# Patient Record
Sex: Male | Born: 1945 | Race: White | Hispanic: No | Marital: Married | Smoking: Never smoker
Health system: Southern US, Community
[De-identification: ages and names within clinical notes are randomized; demographics above are authoritative.]

## PROBLEM LIST (undated history)

## (undated) DIAGNOSIS — I10 Essential (primary) hypertension: Secondary | ICD-10-CM

## (undated) DIAGNOSIS — D759 Disease of blood and blood-forming organs, unspecified: Secondary | ICD-10-CM

## (undated) DIAGNOSIS — L709 Acne, unspecified: Secondary | ICD-10-CM

## (undated) DIAGNOSIS — L309 Dermatitis, unspecified: Secondary | ICD-10-CM

## (undated) DIAGNOSIS — G4730 Sleep apnea, unspecified: Secondary | ICD-10-CM

## (undated) DIAGNOSIS — M1990 Unspecified osteoarthritis, unspecified site: Secondary | ICD-10-CM

## (undated) DIAGNOSIS — Z8669 Personal history of other diseases of the nervous system and sense organs: Secondary | ICD-10-CM

## (undated) HISTORY — DX: Dermatitis, unspecified: L30.9

## (undated) HISTORY — DX: Acne, unspecified: L70.9

## (undated) HISTORY — DX: Essential (primary) hypertension: I10

## (undated) HISTORY — PX: BACK SURGERY: SHX140

## (undated) HISTORY — DX: Personal history of other diseases of the nervous system and sense organs: Z86.69

## (undated) HISTORY — PX: CARPAL TUNNEL RELEASE: SHX101

## (undated) HISTORY — PX: TONSILLECTOMY: SUR1361

## (undated) HISTORY — PX: ROTATOR CUFF REPAIR: SHX139

## (undated) HISTORY — PX: CATARACT EXTRACTION: SUR2

## (undated) HISTORY — PX: PERCUTANEOUS LIVER BIOPSY: SUR136

---

## 1991-10-28 DIAGNOSIS — D759 Disease of blood and blood-forming organs, unspecified: Secondary | ICD-10-CM

## 1991-10-28 HISTORY — DX: Disease of blood and blood-forming organs, unspecified: D75.9

## 1998-02-21 ENCOUNTER — Encounter (HOSPITAL_COMMUNITY): Admission: RE | Admit: 1998-02-21 | Discharge: 1998-05-22 | Payer: Self-pay | Admitting: Internal Medicine

## 1998-06-13 ENCOUNTER — Encounter (HOSPITAL_COMMUNITY): Admission: RE | Admit: 1998-06-13 | Discharge: 1998-09-11 | Payer: Self-pay | Admitting: Internal Medicine

## 1998-10-03 ENCOUNTER — Encounter (HOSPITAL_COMMUNITY): Admission: RE | Admit: 1998-10-03 | Discharge: 1999-01-01 | Payer: Self-pay | Admitting: Internal Medicine

## 1999-01-31 ENCOUNTER — Encounter (HOSPITAL_COMMUNITY): Admission: RE | Admit: 1999-01-31 | Discharge: 1999-05-01 | Payer: Self-pay | Admitting: Internal Medicine

## 1999-06-03 ENCOUNTER — Encounter (HOSPITAL_COMMUNITY): Admission: RE | Admit: 1999-06-03 | Discharge: 1999-09-01 | Payer: Self-pay | Admitting: Internal Medicine

## 1999-10-03 ENCOUNTER — Encounter (HOSPITAL_COMMUNITY): Admission: RE | Admit: 1999-10-03 | Discharge: 2000-01-01 | Payer: Self-pay | Admitting: Internal Medicine

## 1999-10-29 ENCOUNTER — Ambulatory Visit: Admission: RE | Admit: 1999-10-29 | Discharge: 1999-10-29 | Payer: Self-pay | Admitting: Family Medicine

## 2000-01-27 ENCOUNTER — Encounter (HOSPITAL_COMMUNITY): Admission: RE | Admit: 2000-01-27 | Discharge: 2000-04-26 | Payer: Self-pay | Admitting: Internal Medicine

## 2000-03-05 ENCOUNTER — Encounter: Payer: Self-pay | Admitting: Family Medicine

## 2000-03-05 ENCOUNTER — Ambulatory Visit (HOSPITAL_COMMUNITY): Admission: RE | Admit: 2000-03-05 | Discharge: 2000-03-05 | Payer: Self-pay | Admitting: Family Medicine

## 2000-06-01 ENCOUNTER — Encounter (HOSPITAL_COMMUNITY): Admission: RE | Admit: 2000-06-01 | Discharge: 2000-08-30 | Payer: Self-pay | Admitting: Family Medicine

## 2000-09-28 ENCOUNTER — Encounter (HOSPITAL_COMMUNITY): Admission: RE | Admit: 2000-09-28 | Discharge: 2000-12-27 | Payer: Self-pay | Admitting: Family Medicine

## 2001-04-01 ENCOUNTER — Encounter (HOSPITAL_COMMUNITY): Admission: RE | Admit: 2001-04-01 | Discharge: 2001-06-30 | Payer: Self-pay | Admitting: Family Medicine

## 2001-07-29 ENCOUNTER — Encounter (HOSPITAL_COMMUNITY): Admission: RE | Admit: 2001-07-29 | Discharge: 2001-10-27 | Payer: Self-pay | Admitting: Family Medicine

## 2001-11-29 ENCOUNTER — Encounter (HOSPITAL_COMMUNITY): Admission: RE | Admit: 2001-11-29 | Discharge: 2002-02-27 | Payer: Self-pay | Admitting: Family Medicine

## 2002-03-29 ENCOUNTER — Encounter (HOSPITAL_COMMUNITY): Admission: RE | Admit: 2002-03-29 | Discharge: 2002-06-27 | Payer: Self-pay | Admitting: Family Medicine

## 2002-08-01 ENCOUNTER — Encounter (HOSPITAL_COMMUNITY): Admission: RE | Admit: 2002-08-01 | Discharge: 2002-10-30 | Payer: Self-pay | Admitting: Family Medicine

## 2002-11-28 ENCOUNTER — Encounter (HOSPITAL_COMMUNITY): Admission: RE | Admit: 2002-11-28 | Discharge: 2003-02-26 | Payer: Self-pay | Admitting: Family Medicine

## 2003-01-24 ENCOUNTER — Ambulatory Visit (HOSPITAL_COMMUNITY): Admission: RE | Admit: 2003-01-24 | Discharge: 2003-01-24 | Payer: Self-pay | Admitting: Gastroenterology

## 2003-04-06 ENCOUNTER — Encounter (HOSPITAL_COMMUNITY): Admission: RE | Admit: 2003-04-06 | Discharge: 2003-07-05 | Payer: Self-pay | Admitting: Family Medicine

## 2003-06-13 ENCOUNTER — Encounter: Payer: Self-pay | Admitting: Family Medicine

## 2003-06-13 ENCOUNTER — Ambulatory Visit (HOSPITAL_COMMUNITY): Admission: RE | Admit: 2003-06-13 | Discharge: 2003-06-13 | Payer: Self-pay | Admitting: Family Medicine

## 2003-09-11 ENCOUNTER — Inpatient Hospital Stay (HOSPITAL_COMMUNITY): Admission: EM | Admit: 2003-09-11 | Discharge: 2003-09-13 | Payer: Self-pay | Admitting: Emergency Medicine

## 2004-01-11 ENCOUNTER — Encounter (HOSPITAL_COMMUNITY): Admission: RE | Admit: 2004-01-11 | Discharge: 2004-04-10 | Payer: Self-pay | Admitting: Family Medicine

## 2004-01-31 ENCOUNTER — Ambulatory Visit (HOSPITAL_COMMUNITY): Admission: RE | Admit: 2004-01-31 | Discharge: 2004-02-01 | Payer: Self-pay | Admitting: Orthopaedic Surgery

## 2004-04-19 ENCOUNTER — Encounter (HOSPITAL_COMMUNITY): Admission: RE | Admit: 2004-04-19 | Discharge: 2004-07-18 | Payer: Self-pay | Admitting: Family Medicine

## 2004-04-26 ENCOUNTER — Encounter: Admission: RE | Admit: 2004-04-26 | Discharge: 2004-04-26 | Payer: Self-pay | Admitting: Orthopaedic Surgery

## 2004-08-12 ENCOUNTER — Encounter (HOSPITAL_COMMUNITY): Admission: RE | Admit: 2004-08-12 | Discharge: 2004-11-10 | Payer: Self-pay | Admitting: Family Medicine

## 2012-05-31 ENCOUNTER — Encounter: Payer: Self-pay | Admitting: Internal Medicine

## 2012-06-01 ENCOUNTER — Encounter: Payer: Self-pay | Admitting: Internal Medicine

## 2012-06-01 ENCOUNTER — Ambulatory Visit (INDEPENDENT_AMBULATORY_CARE_PROVIDER_SITE_OTHER): Payer: Medicare Other | Admitting: Internal Medicine

## 2012-06-01 VITALS — BP 118/72 | HR 106 | Ht 68.0 in | Wt 227.0 lb

## 2012-06-01 DIAGNOSIS — G4733 Obstructive sleep apnea (adult) (pediatric): Secondary | ICD-10-CM

## 2012-06-01 NOTE — Patient Instructions (Addendum)
Order- Melvin Hamilton sleep study   Dx OSA  If the study confirms a diagnosis of sleep apnea, we will be able to work through a DME company to set up CPAP.

## 2012-06-01 NOTE — Progress Notes (Signed)
06/01/12- 66yoM referred by Dr Glean Salen for complaint of snoring with remote history of sleep apnea. He had a sleep study in the 1990s but never got CPAP. Evaluated by Dr. Macie Burows for occasional nasal stuffiness. He denies daytime sleepiness. ENT surgery limited to tonsils. 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 Never smoked. Family history negative for sleep apnea but positive for myocardial infarction. Bedtime between 10 and 12 PM estimating sleep latency 45-60 minutes, waking 2-3 times during the night before up at 8 AM.  Prior to Admission medications   Medication Sig Start Date End Date Taking? Authorizing Provider  doxycycline (VIBRAMYCIN) 100 MG capsule Take 100 mg by mouth daily.    Historical Provider, MD  losartan (COZAAR) 100 MG tablet Take 100 mg by mouth daily.    Historical Provider, MD  naproxen (NAPROSYN) 500 MG tablet Take 500 mg by mouth 2 (two) times daily with a meal.    Historical Provider, MD  phentermine 37.5 MG capsule Take 37.5 mg by mouth daily.    Historical Provider, MD   Past Medical History  Diagnosis Date  . Hypertension   . H/O sleep apnea   . Dermatitis   . Acne    Past Surgical History  Procedure Date  . Back surgery   . Rotator cuff repair     x 2  . Cataract extraction   . Tonsillectomy   . Carpal tunnel release     right   Family History  Problem Relation Age of Onset  . Heart attack Father   . Heart attack Brother   . Prostate cancer Brother     cancer spreading   History   Social History  . Marital Status: Married    Spouse Name: N/A    Number of Children: 2  . Years of Education: N/A   Occupational History  . retired    Social History Main Topics  . Smoking status: Never Smoker   . Smokeless tobacco: Former Neurosurgeon  . Alcohol Use: Yes     2-3 times weekly liqour and beer  . Drug Use: No  . Sexually Active: Not on file   Other Topics Concern  . Not on file     Social History Narrative  . No narrative on file   ROS-see HPI Constitutional:   No-   weight loss, night sweats, fevers, chills, fatigue, lassitude. HEENT:   No-  headaches, difficulty swallowing, tooth/dental problems, sore throat,       No-  sneezing, itching, ear ache, +nasal congestion, post nasal drip,  CV:  No-   chest pain, orthopnea, PND, swelling in lower extremities, anasarca,  dizziness, palpitations Resp: No-   shortness of breath with exertion or at rest.              No-   productive cough,  No non-productive cough,  No- coughing up of blood.              No-   change in color of mucus.  No- wheezing.   Skin: No-   rash or lesions. GI:  No-   heartburn, indigestion, abdominal pain, nausea, vomiting, diarrhea,                 change in bowel habits, loss of appetite GU: No-   dysuria, change in color of urine, no urgency or frequency.  No- flank pain. MS:  No-   joint pain or  swelling.  No- decreased range of motion.  No- back pain. Neuro-     nothing unusual Psych:  No- change in mood or affect. No depression or anxiety.  No memory loss.  OBJ- Physical Exam General- Alert, Oriented, Affect-appropriate, Distress- none acute, stocky body build Skin- rash-none, lesions- none, excoriation- none Lymphadenopathy- none Head- atraumatic            Eyes- Gross vision intact, PERRLA, conjunctivae and secretions clear            Ears- Hearing, canals-normal            Nose- Clear, no-Septal dev, mucus, polyps, erosion, perforation             Throat- Mallampati III , mucosa clear , drainage- none, tonsils- atrophic, own teeth Neck- flexible , trachea midline, no stridor , thyroid nl, carotid no bruit Chest - symmetrical excursion , unlabored           Heart/CV- RRR , no murmur , no gallop  , no rub, nl s1 s2                           - JVD- none , edema- none, stasis changes- none, varices- none           Lung- clear to P&A, wheeze- none, cough- none , dullness-none, rub-  none           Chest wall-  Abd- tender-no, distended-no, bowel sounds-present, HSM- no Br/ Gen/ Rectal- Not done, not indicated Extrem- cyanosis- none, clubbing, none, atrophy- none, strength- nl Neuro- grossly intact to observation

## 2012-06-03 ENCOUNTER — Ambulatory Visit (INDEPENDENT_AMBULATORY_CARE_PROVIDER_SITE_OTHER): Payer: Medicare Other | Admitting: Internal Medicine

## 2012-06-03 DIAGNOSIS — G4733 Obstructive sleep apnea (adult) (pediatric): Secondary | ICD-10-CM

## 2012-06-06 ENCOUNTER — Encounter: Payer: Self-pay | Admitting: Internal Medicine

## 2012-06-06 DIAGNOSIS — G4733 Obstructive sleep apnea (adult) (pediatric): Secondary | ICD-10-CM | POA: Insufficient documentation

## 2012-06-06 NOTE — Assessment & Plan Note (Signed)
History the sleep apnea was documented in the 1990s but he did not followup for treatment. He has gained some weight since then. Plan-sleep study-unattended if appropriate. We discussed the physiology of sleep apnea, medical concerns, driving responsibility and sleep hygiene.

## 2012-06-07 ENCOUNTER — Institutional Professional Consult (permissible substitution): Payer: Self-pay | Admitting: Pulmonary Disease

## 2012-06-07 ENCOUNTER — Other Ambulatory Visit: Payer: Self-pay | Admitting: Internal Medicine

## 2012-06-07 DIAGNOSIS — G4733 Obstructive sleep apnea (adult) (pediatric): Secondary | ICD-10-CM

## 2012-06-08 ENCOUNTER — Telehealth: Payer: Self-pay | Admitting: Internal Medicine

## 2012-06-08 NOTE — Telephone Encounter (Signed)
Results showed patient does have OSA and needs CPAP-order has been faxed by [facility] to [facility] for patient to be set up for CPAP. I had to leave message for patient to call back and speak with me or triage regarding this.

## 2012-06-08 NOTE — Telephone Encounter (Signed)
Spoke with patient and advised that Jasmine December at [employer] was trying to obtain prior authorization on cpap device. Explained to patient that this process normally takes 48-72 hours to obtain prior authorization, and appointment to scheduled for cpap set up. Pt stated that [employer] had called and left a message for him, but he was not available when she called. Advised patient to contact Jasmine December at [employer] [phone]) to check on status of authorization and cpap set up appointment. Rhonda J Cobb

## 2012-06-08 NOTE — Telephone Encounter (Signed)
Spoke with patient-aware that results from Home Sleep Study shows he would benefit from CPAP due to Dx of OSA. PT aware order sent to [facility] to set up CPAP-pt states he has not heard from the company and was upset that he had to call for results. I expressed my apologies as I was not aware that patient did not know the results and stated I would express this concern to [facility]. [employer] stated they did not have order-spoke with Bjorn Loser as Almyra Free gone for the day-she will contact them to find out what happened. Will forward to [place] to document.    Pt's number: [phone]

## 2012-06-08 NOTE — Telephone Encounter (Signed)
Called, spoke with pt.  States he had a home sleep study done on Aug 6 but hasn't heard anything about the results.  Results printed and placed on CDY's desk -- pls advise.  Thank you.

## 2012-06-08 NOTE — Telephone Encounter (Signed)
Called and spoke with Jasmine December at [employer]. She stated that she received the referral and they are in the process of obtaining pre approval with pt's secondary [employer]. Pt's insurance requires that this is completed before cpap is set up. Asked Jasmine December to contact pt today to explain this to him. I will follow up later this afternoon with a phone call to the patient. Rhonda J Cobb

## 2012-07-13 ENCOUNTER — Ambulatory Visit: Payer: Medicare Other | Admitting: Internal Medicine

## 2012-08-05 ENCOUNTER — Ambulatory Visit (INDEPENDENT_AMBULATORY_CARE_PROVIDER_SITE_OTHER): Payer: Medicare Other | Admitting: Internal Medicine

## 2012-08-05 VITALS — BP 134/84 | HR 81 | Ht 68.0 in | Wt 229.4 lb

## 2012-08-05 DIAGNOSIS — G4733 Obstructive sleep apnea (adult) (pediatric): Secondary | ICD-10-CM

## 2012-08-10 ENCOUNTER — Encounter: Payer: Self-pay | Admitting: Internal Medicine

## 2012-08-11 ENCOUNTER — Encounter: Payer: Self-pay | Admitting: Internal Medicine

## 2012-08-11 NOTE — Progress Notes (Signed)
06/01/12- 66yoM referred by Dr Glean Salen for complaint of snoring with remote history of sleep apnea. He had a sleep study in the 1990s but never got CPAP. Evaluated by Dr. Macie Burows for occasional nasal stuffiness. He denies daytime sleepiness. ENT surgery limited to tonsils. 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 Never smoked. Family history negative for sleep apnea but positive for myocardial infarction. Bedtime between 10 and 12 PM estimating sleep latency 45-60 minutes, waking 2-3 times during the night before up at 8 AM.  08/05/12-  66yoM referred by Dr Glean Salen for complaint of snoring with remote history of sleep apnea. He had a sleep study in the 1990s but never got CPAP. Unattended [place] [facility]) home sleep study on 06/01/2012-AHI 16 per hour , moderately severe obstructive sleep apnea. We got him started with CPAP/ [facility] and are pending download report. He has been using all night every night and doing well. He is satisfied except for some difficulty initiating sleep. We discussed sleep hygiene and management of an insomnia component  ROS-see HPI Constitutional:   No-   weight loss, night sweats, fevers, chills, fatigue, lassitude. HEENT:   No-  headaches, difficulty swallowing, tooth/dental problems, sore throat,       No-  sneezing, itching, ear ache, +nasal congestion, post nasal drip,  CV:  No-   chest pain, orthopnea, PND, swelling in lower extremities, anasarca,  dizziness, palpitations Resp: No-   shortness of breath with exertion or at rest.              No-   productive cough,  No non-productive cough,  No- coughing up of blood.              No-   change in color of mucus.  No- wheezing.   Skin: No-   rash or lesions. GI:  No-   heartburn, indigestion, abdominal pain, nausea, vomiting,  GU:  MS:  No-   joint pain or swelling.   Neuro-     nothing unusual Psych:  No- change in mood or affect. No  depression or anxiety.  No memory loss.  OBJ- Physical Exam General- Alert, Oriented, Affect-guarded/ surly?, Distress- none acute, stocky body build Skin- rash-none, lesions- none, excoriation- none Lymphadenopathy- none Head- atraumatic            Eyes- Gross vision intact, PERRLA, conjunctivae and secretions clear            Ears- Hearing, canals-normal            Nose- Clear, no-Septal dev, mucus, polyps, erosion, perforation             Throat- Mallampati III , mucosa clear , drainage- none, tonsils- atrophic, own teeth Neck- flexible , trachea midline, no stridor , thyroid nl, carotid no bruit Chest - symmetrical excursion , unlabored           Heart/CV- RRR , no murmur , no gallop  , no rub, nl s1 s2                           - JVD- none , edema- none, stasis changes- none, varices- none           Lung- clear to P&A, wheeze- none, cough- none , dullness-none, rub- none           Chest wall-  Abd-  Br/ Gen/ Rectal- Not done, not  indicated Extrem- cyanosis- none, clubbing, none, atrophy- none, strength- nl Neuro- grossly intact to observation

## 2012-08-11 NOTE — Patient Instructions (Addendum)
We will contact your DME company for download report and use that to set a fixed CPAP pressure.  Prescription for Ambien 10 mg  Please call as needed

## 2012-08-11 NOTE — Assessment & Plan Note (Signed)
We are awaiting the download from his home care company. I anticipate changing to a fixed pressure as discussed with him. We will help his difficulty initiating sleep by trying Ambien. We discussed tolerance, dependence, sleepwalking/parasomnias. He is encouraged to call as needed.

## 2012-08-28 ENCOUNTER — Observation Stay (HOSPITAL_COMMUNITY)
Admission: EM | Admit: 2012-08-28 | Discharge: 2012-08-29 | Disposition: A | Discharge status: Home / Self Care | Payer: Medicare Other | Attending: Internal Medicine | Admitting: Internal Medicine

## 2012-08-28 ENCOUNTER — Emergency Department (HOSPITAL_COMMUNITY): Payer: Medicare Other

## 2012-08-28 ENCOUNTER — Encounter (HOSPITAL_COMMUNITY): Payer: Self-pay | Admitting: Internal Medicine

## 2012-08-28 ENCOUNTER — Other Ambulatory Visit: Payer: Self-pay

## 2012-08-28 DIAGNOSIS — Y9289 Other specified places as the place of occurrence of the external cause: Secondary | ICD-10-CM | POA: Insufficient documentation

## 2012-08-28 DIAGNOSIS — S2239XA Fracture of one rib, unspecified side, initial encounter for closed fracture: Secondary | ICD-10-CM

## 2012-08-28 DIAGNOSIS — G4733 Obstructive sleep apnea (adult) (pediatric): Secondary | ICD-10-CM

## 2012-08-28 DIAGNOSIS — R55 Syncope and collapse: Secondary | ICD-10-CM

## 2012-08-28 DIAGNOSIS — W14XXXA Fall from tree, initial encounter: Secondary | ICD-10-CM | POA: Diagnosis present

## 2012-08-28 DIAGNOSIS — W1789XA Other fall from one level to another, initial encounter: Secondary | ICD-10-CM | POA: Insufficient documentation

## 2012-08-28 DIAGNOSIS — I951 Orthostatic hypotension: Principal | ICD-10-CM | POA: Insufficient documentation

## 2012-08-28 DIAGNOSIS — Z79899 Other long term (current) drug therapy: Secondary | ICD-10-CM | POA: Insufficient documentation

## 2012-08-28 DIAGNOSIS — S2249XA Multiple fractures of ribs, unspecified side, initial encounter for closed fracture: Secondary | ICD-10-CM

## 2012-08-28 DIAGNOSIS — Y9389 Activity, other specified: Secondary | ICD-10-CM | POA: Insufficient documentation

## 2012-08-28 DIAGNOSIS — I10 Essential (primary) hypertension: Secondary | ICD-10-CM

## 2012-08-28 LAB — CBC WITH DIFFERENTIAL/PLATELET
Basophils Relative: 0 % (ref 0–1)
Eosinophils Relative: 0 % (ref 0–5)
Hemoglobin: 11.8 g/dL — ABNORMAL LOW (ref 13.0–17.0)
Lymphocytes Relative: 4 % — ABNORMAL LOW (ref 12–46)
MCH: 27.3 pg (ref 26.0–34.0)
Monocytes Relative: 4 % (ref 3–12)
Neutrophils Relative %: 92 % — ABNORMAL HIGH (ref 43–77)
Platelets: 243 10*3/uL (ref 150–400)
RBC: 4.33 MIL/uL (ref 4.22–5.81)
WBC: 16.8 10*3/uL — ABNORMAL HIGH (ref 4.0–10.5)

## 2012-08-28 LAB — COMPREHENSIVE METABOLIC PANEL
ALT: 22 U/L (ref 0–53)
AST: 28 U/L (ref 0–37)
Alkaline Phosphatase: 72 U/L (ref 39–117)
CO2: 24 mEq/L (ref 19–32)
Calcium: 8.8 mg/dL (ref 8.4–10.5)
Chloride: 99 mEq/L (ref 96–112)
GFR calc Af Amer: >90 mL/min (ref >90)
GFR calc non Af Amer: >90 mL/min (ref >90)
Glucose, Bld: 131 mg/dL — ABNORMAL HIGH (ref 70–99)
Potassium: 3.7 mEq/L (ref 3.5–5.1)
Sodium: 134 mEq/L — ABNORMAL LOW (ref 135–145)
Total Bilirubin: 0.4 mg/dL (ref 0.3–1.2)

## 2012-08-28 MED ORDER — FENTANYL CITRATE 0.05 MG/ML IJ SOLN
100.0000 ug | Freq: Once | INTRAMUSCULAR | Status: AC
  Administered 2012-08-28: 100 ug via INTRAVENOUS
  Filled 2012-08-28: qty 2

## 2012-08-28 MED ORDER — SODIUM CHLORIDE 0.9 % IV BOLUS (SEPSIS): 500.0000 mL | Freq: Once | INTRAVENOUS | Status: DC

## 2012-08-28 MED ORDER — SODIUM CHLORIDE 0.9 % IV SOLN
INTRAVENOUS | Status: DC
  Administered 2012-08-28: 125 mL/h via INTRAVENOUS
  Administered 2012-08-28: 21:00:00 via INTRAVENOUS

## 2012-08-28 MED ORDER — MORPHINE SULFATE 2 MG/ML IJ SOLN
2.0000 mg | INTRAMUSCULAR | Status: DC | PRN
  Administered 2012-08-28 – 2012-08-29 (×4): 2 mg via INTRAVENOUS
  Filled 2012-08-28 (×3): qty 1

## 2012-08-28 NOTE — ED Notes (Signed)
Pt presents to the ED after falling from a deer stand which was about 10 feet in the air.  Pt is complaining of rib pain.  Pt states he lost consciousness for an undetermined amount of time.

## 2012-08-28 NOTE — H&P (Signed)
PCP:    Quitman Livings, MD [facility]   Chief Complaint:   Fall after syncope  HPI: Melvin Hamilton is a 66 y.o. male   has a past medical history of Hypertension; H/O sleep apnea; Dermatitis; and Acne.   Presented with  Here after a fall out of tree. 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 This event took place at 5 pm and was un vitnesed.He was conscious rapidly after hitting the ground. No urinary or bowel incontinence. No tongue biting. Has no prior hx of syncope felt well prior to this. He states he had a shot of liquor about 12 pm.  He states he is not a heavy drinker. His brother died of MI at age of 22. NO chest pains or SOB with activity.  He uses CPAP for his sleep apnea.   Review of Systems:    Pertinent positives include: syncope  Constitutional:  No weight loss, night sweats, Fevers, chills, fatigue, weight loss  HEENT:  No headaches, Difficulty swallowing,Tooth/dental problems,Sore throat,  No sneezing, itching, ear ache, nasal congestion, post nasal drip,  Cardio-vascular:  No chest pain, Orthopnea, PND, anasarca, dizziness, palpitations.no Bilateral lower extremity swelling  GI:  No heartburn, indigestion, abdominal pain, nausea, vomiting, diarrhea, change in bowel habits, loss of appetite, melena, blood in stool, hematemesis Resp:  no shortness of breath at rest. No dyspnea on exertion, No excess mucus, no productive cough, No non-productive cough, No coughing up of blood.No change in color of mucus.No wheezing. Skin:  no rash or lesions. No jaundice GU:  no dysuria, change in color of urine, no urgency or frequency. No straining to urinate.  No flank pain.  Musculoskeletal:  No joint pain or no joint swelling. No decreased range of motion. No back pain.  Psych:  No change in mood or affect. No depression or anxiety. No memory loss.  Neuro: no  localizing neurological complaints, no tingling, no weakness, no double vision, no gait abnormality, no slurred speech, no confusion  Otherwise ROS are negative except for above, 10 systems were reviewed  Past Medical History: Past Medical History  Diagnosis Date  . Hypertension   . H/O sleep apnea   . Dermatitis   . Acne    Past Surgical History  Procedure Date  . Back surgery   . Rotator cuff repair     x 2  . Cataract extraction   . Tonsillectomy   . Carpal tunnel release     right     Medications: Prior to Admission medications   Medication Sig Start Date End Date Taking? Authorizing Provider  losartan (COZAAR) 100 MG tablet Take 100 mg by mouth daily.   Yes Historical Provider, MD    Allergies:  No Known Allergies  Social History:  Ambulatory   independently   Lives at  Home with wife   reports that he has never smoked. He has quit using smokeless tobacco. He reports that he drinks alcohol. He reports that he does not use illicit drugs.   Family History: family history includes Heart attack in his brother and father and Prostate cancer in his brother.    Physical Exam: Patient Vitals for the past 24 hrs:  BP Temp Temp src Pulse Resp SpO2  08/28/12 2056 118/86 mmHg 97.6 F (36.4 C) Oral 82  18  96 %  1. General:  in No Acute distress 2. Psychological: Alert and  Oriented 3. Head/ENT:     Dry Mucous Membranes                          Head bruise noted over left forehead and left side of the lip, neck supple                          Normal  Dentition 4. SKIN: normal  Skin turgor,  Skin clean Dry and intact no rash, some bruising noted 5. Heart: Regular rate and rhythm no Murmur, Rub or gallop 6. Lungs: Clear to auscultation bilaterally, no wheezes or crackles   7. Abdomen: Soft, non-tender, Non distended 8. Lower extremities: no clubbing, cyanosis, or edema 9. Neurologically Strength 5/5 in all 4 ext. CN 2-12 intact 10. MSK: Normal range of  motion  body mass index is unknown because there is no height or weight on file.   Labs on Admission:   [facility] 08/28/12 2108  NA 134*  K 3.7  CL 99  CO2 24  GLUCOSE 131*  BUN 16  CREATININE 0.79  CALCIUM 8.8  MG --  PHOS --    Basename 08/28/12 2108  AST 28  ALT 22  ALKPHOS 72  BILITOT 0.4  PROT 6.7  ALBUMIN 3.8   No results found for this basename: LIPASE:2,AMYLASE:2 in the last 72 hours  Basename 08/28/12 2108  WBC 16.8*  NEUTROABS 15.4*  HGB 11.8*  HCT 35.6*  MCV 82.2  PLT 243    Basename 08/28/12 2108  CKTOTAL --  CKMB --  CKMBINDEX --  TROPONINI <0.30   No results found for this basename: TSH,T4TOTAL,FREET3,T3FREE,THYROIDAB in the last 72 hours No results found for this basename: VITAMINB12:2,FOLATE:2,FERRITIN:2,TIBC:2,IRON:2,RETICCTPCT:2 in the last 72 hours No results found for this basename: HGBA1C    The CrCl is unknown because both a height and weight (above a minimum accepted value) are required for this calculation. ABG No results found for this basename: phart, pco2, po2, hco3, tco2, acidbasedef, o2sat     No results found for this basename: DDIMER     Other results:  I have pearsonaly reviewed this: ECG REPORT  Rate:79  Rhythm: NSR ST&T Change: T wave flattening in III and avF   Cultures: No results found for this basename: sdes, specrequest, cult, reptstatus       Radiological Exams on Admission: Ct Head Wo Contrast  08/28/2012  *RADIOLOGY REPORT*  Clinical Data:  syncope, fall.  CT HEAD WITHOUT CONTRAST CT CERVICAL SPINE WITHOUT CONTRAST  Technique:  Multidetector CT imaging of the head and cervical spine was performed following the standard protocol without intravenous contrast.  Multiplanar CT image reconstructions of the cervical spine were also generated.  Comparison:  None.  CT HEAD  Findings: No acute intracranial abnormality.  Specifically, no hemorrhage, hydrocephalus, mass lesion, acute infarction, or significant  intracranial injury.  No acute calvarial abnormality. Visualized paranasal sinuses and mastoids clear.  Orbital soft tissues unremarkable.  IMPRESSION: No acute intracranial abnormality.  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  Prevertebral soft tissues are normal. Alignment is normal.  No fracture.  No epidural or paraspinal hematoma.  IMPRESSION: Degenerative changes.  No acute findings.   Original Report Authenticated By: Charlett Nose, M.D.    Ct Cervical Spine Wo Contrast  08/28/2012  *RADIOLOGY REPORT*  Clinical Data:  syncope, fall.  CT HEAD WITHOUT CONTRAST CT CERVICAL SPINE WITHOUT CONTRAST  Technique:  Multidetector CT imaging of the head and cervical spine was performed following the standard protocol without intravenous contrast.  Multiplanar CT image reconstructions of the cervical spine were also generated.  Comparison:  None.  CT HEAD  Findings: No acute intracranial abnormality.  Specifically, no hemorrhage, hydrocephalus, mass lesion, acute infarction, or significant intracranial injury.  No acute calvarial abnormality. Visualized paranasal sinuses and mastoids clear.  Orbital soft tissues unremarkable.  IMPRESSION: No acute intracranial abnormality.  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  Prevertebral soft tissues are normal. Alignment is normal.  No fracture.  No epidural or paraspinal hematoma.  IMPRESSION: Degenerative changes.  No acute findings.   Original Report Authenticated By: Charlett Nose, M.D.    Dg Chest Port 1 View  08/28/2012  **ADDENDUM** CREATED: 08/28/2012 22:01:38  There are lateral rib fractures involving the left fourth, fifth and sixth ribs.  No pneumothorax.  **END ADDENDUM** SIGNED BY: Aubery Lapping. [place], M.D.   08/28/2012  *RADIOLOGY REPORT*  Clinical Data: Fall, chest and rib pain.  PORTABLE CHEST - 1 VIEW  Comparison: None.  Findings: Heart and  mediastinal contours are within normal limits. No focal opacities or effusions.  No acute bony abnormality.  IMPRESSION: No active cardiopulmonary disease.   Original Report Authenticated By: Charlett Nose, M.D.     Chart has been reviewed  Assessment/Plan  66 yo w hx of OSA and HTN with sudden syncope resulting in fall and rib fracture.  Present on Admission:  .Syncope and collapse - patient denies orthopnea, no chest pain, no prior similar events. Sudden nature of syncope makes cardiac event more likely. Will observe on telemetry obtain ECHO, carotid dopplers and cycle cardiac enzymes he would likely benefit from further cardiac evaluation. Recommend cardiac follow up or inpatient consult.  .Fall from tree - due to syncope .Obstructive sleep apnea - will write for CPAP .Hypertension - continue cozaar with holding parameters  .Rib fracture - pain control   Prophylaxis: Lovenox, Protonix  CODE STATUS: FULL CODE  Other plan as per orders.  I have spent a total of 55 min on this admission  Melvin Hamilton 08/28/2012, 10:58 PM

## 2012-08-28 NOTE — ED Provider Notes (Signed)
History     CSN: [id number]  Arrival date & time 08/28/12  2045   First MD Initiated Contact with Patient 08/28/12 2057      Chief Complaint  Patient presents with  . Fall    (Consider location/radiation/quality/duration/timing/severity/associated sxs/prior treatment) HPI This 66 year old male was a deer stand about 10 feet off the ground when he was watching 3 deer and he suddenly felt warm and flushed for a second and the next thing he knew he woke up on the ground having fallen off the deer stand. He has no idea how long he was unconscious for. He did not have any palpitations chest pain or shortness breath before he fell. He complains of severe left-sided chest pain only. He denies any headache or neck pain or back pain. He denies pain or injury to his extremities. He was able to walk over 100 yards to his vehicle to get help. He denies abdominal pain or vomiting. He has no focal weakness or numbness. He has severe left-sided chest pain only without other concerns. He does have some superficial abrasions on his head but states his tetanus is up-to-date. There is no active bleeding. He is no change in speech vision swallowing or understanding. He is no vertigo. He does not have a history of syncope.  No treatment PTA except one Vicodin no relief. Past Medical History  Diagnosis Date  . Hypertension   . H/O sleep apnea   . Dermatitis   . Acne     Past Surgical History  Procedure Date  . Back surgery   . Rotator cuff repair     x 2  . Cataract extraction   . Tonsillectomy   . Carpal tunnel release     right    Family History  Problem Relation Age of Onset  . Heart attack Father   . Heart attack Brother   . Prostate cancer Brother     cancer spreading    History  Substance Use Topics  . Smoking status: Never Smoker   . Smokeless tobacco: Former Neurosurgeon  . Alcohol Use: Yes     Comment: 2-3 times weekly liqour and beer      Review of Systems 10 Systems reviewed and  are negative for acute change except as noted in the HPI. Allergies  Review of patient's allergies indicates no known allergies.  Home Medications   Current Outpatient Rx  Name  Route  Sig  Dispense  Refill  . LOSARTAN POTASSIUM 100 MG PO TABS   Oral   Take 100 mg by mouth daily.         . OXYCODONE-ACETAMINOPHEN 7.5-325 MG PO TABS   Oral   Take 1 tablet by mouth every 4 (four) hours as needed for pain.   65 tablet   0     BP 123/71  Pulse 66  Temp 97.7 F (36.5 C) (Oral)  Resp 16  Ht 5\' 8"  (1.727 m)  Wt 223 lb 12.3 oz (101.5 kg)  BMI 34.02 kg/m2  SpO2 97%  Physical Exam  Nursing note and vitals reviewed. Constitutional:       Awake, alert, nontoxic appearance.  HENT:       Superficial scalp abrasions; dentition intact normal jaw occlusion and teeth intact no facial bony tenderness  Eyes: Right eye exhibits no discharge. Left eye exhibits no discharge.  Neck: Neck supple.  Cardiovascular: Normal rate and regular rhythm.   No murmur heard. Pulmonary/Chest: Effort normal and breath sounds normal. No  respiratory distress. He has no wheezes. He has no rales. He exhibits tenderness.       RS sat normal 98%; left chest wall diffusely tender with slight crepitus without subcut emphysema or apparent flail chest  Abdominal: Soft. There is no tenderness. There is no rebound.  Musculoskeletal: He exhibits no tenderness.       Baseline ROM, no obvious new focal weakness. CR<2secs all 4 ext and normal LT all 4 ext  Neurological: He is alert.       Mental status and motor strength appears baseline for patient and situation. GCS 15  pupils equal round reactive to light, extraocular movements intact, peripheral visual fields full to confrontation, no facial asymmetry, no pronator drift in his arms, normal light touch in all 4 extremities, normal bilateral finger to nose testing, the patient states he was walking normally prior to arrival.  Skin: No rash noted.  Psychiatric: He has a  normal mood and affect.    ED Course  Procedures (including critical care time) ECG: Sinus rhythm, ventricular rate 79, normal axis, left ventricular hypertrophy, no acute ischemic changes noted, inferior Q waves, no significant change noted status compared with November 2004  D/w [facility] for Obs due to syncope.Patient / Family / Caregiver informed of clinical course, understand medical decision-making process, and agree with plan.Pt stable in ED with no significant deterioration in condition.  Labs Reviewed  CBC WITH DIFFERENTIAL - Abnormal; Notable for the following:    WBC 16.8 (*)     Hemoglobin 11.8 (*)     HCT 35.6 (*)     Neutrophils Relative 92 (*)     Lymphocytes Relative 4 (*)     Neutro Abs 15.4 (*)     All other components within normal limits  COMPREHENSIVE METABOLIC PANEL - Abnormal; Notable for the following:    Sodium 134 (*)     Glucose, Bld 131 (*)     All other components within normal limits  COMPREHENSIVE METABOLIC PANEL - Abnormal; Notable for the following:    Calcium 8.2 (*)     Total Protein 5.7 (*)     Albumin 3.1 (*)     GFR calc non Af Amer 87 (*)     All other components within normal limits  CBC - Abnormal; Notable for the following:    RBC 3.81 (*)     Hemoglobin 10.2 (*)     HCT 31.8 (*)     All other components within normal limits  HEMOGLOBIN A1C - Abnormal; Notable for the following:    Hemoglobin A1C 5.8 (*)     Mean Plasma Glucose 120 (*)     All other components within normal limits  LIPID PANEL - Abnormal; Notable for the following:    HDL 33 (*)     All other components within normal limits  D-DIMER, QUANTITATIVE - Abnormal; Notable for the following:    D-Dimer, Quant 0.57 (*)     All other components within normal limits  URINALYSIS, ROUTINE W REFLEX MICROSCOPIC  TROPONIN I  MAGNESIUM  PHOSPHORUS  TSH  TROPONIN I   Ct Head Wo Contrast  08/28/2012  *RADIOLOGY REPORT*  Clinical Data:  syncope, fall.  CT HEAD WITHOUT CONTRAST CT  CERVICAL SPINE WITHOUT CONTRAST  Technique:  Multidetector CT imaging of the head and cervical spine was performed following the standard protocol without intravenous contrast.  Multiplanar CT image reconstructions of the cervical spine were also generated.  Comparison:  None.  CT HEAD  Findings: No acute intracranial abnormality.  Specifically, no hemorrhage, hydrocephalus, mass lesion, acute infarction, or significant intracranial injury.  No acute calvarial abnormality. Visualized paranasal sinuses and mastoids clear.  Orbital soft tissues unremarkable.  IMPRESSION: No acute intracranial abnormality.  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  Prevertebral soft tissues are normal. Alignment is normal.  No fracture.  No epidural or paraspinal hematoma.  IMPRESSION: Degenerative changes.  No acute findings.   Original Report Authenticated By: Charlett Nose, M.D.    Ct Cervical Spine Wo Contrast  08/28/2012  *RADIOLOGY REPORT*  Clinical Data:  syncope, fall.  CT HEAD WITHOUT CONTRAST CT CERVICAL SPINE WITHOUT CONTRAST  Technique:  Multidetector CT imaging of the head and cervical spine was performed following the standard protocol without intravenous contrast.  Multiplanar CT image reconstructions of the cervical spine were also generated.  Comparison:  None.  CT HEAD  Findings: No acute intracranial abnormality.  Specifically, no hemorrhage, hydrocephalus, mass lesion, acute infarction, or significant intracranial injury.  No acute calvarial abnormality. Visualized paranasal sinuses and mastoids clear.  Orbital soft tissues unremarkable.  IMPRESSION: No acute intracranial abnormality.  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  Prevertebral soft tissues are normal. Alignment is normal.  No fracture.  No epidural or paraspinal hematoma.  IMPRESSION: Degenerative changes.  No acute findings.    Original Report Authenticated By: Charlett Nose, M.D.    Dg Chest Port 1 View  08/28/2012  **ADDENDUM** CREATED: 08/28/2012 22:01:38  There are lateral rib fractures involving the left fourth, fifth and sixth ribs.  No pneumothorax.  **END ADDENDUM** SIGNED BY: Aubery Lapping. Dover, M.D.   08/28/2012  *RADIOLOGY REPORT*  Clinical Data: Fall, chest and rib pain.  PORTABLE CHEST - 1 VIEW  Comparison: None.  Findings: Heart and mediastinal contours are within normal limits. No focal opacities or effusions.  No acute bony abnormality.  IMPRESSION: No active cardiopulmonary disease.   Original Report Authenticated By: Charlett Nose, M.D.      1. Syncope   2. Multiple rib fractures   3. Hypertension   4. Obstructive sleep apnea   5. Syncope and collapse   6. Fall from tree   7. Rib fracture       MDM  The patient appears reasonably stabilized for admission considering the current resources, flow, and capabilities available in the ED at this time, and I doubt any other [facility] requiring further screening and/or treatment in the ED prior to admission.        Hurman Horn, MD 08/29/12 [phone]

## 2012-08-29 ENCOUNTER — Encounter (HOSPITAL_COMMUNITY): Payer: Self-pay | Admitting: *Deleted

## 2012-08-29 DIAGNOSIS — W1789XA Other fall from one level to another, initial encounter: Secondary | ICD-10-CM

## 2012-08-29 DIAGNOSIS — R55 Syncope and collapse: Secondary | ICD-10-CM

## 2012-08-29 DIAGNOSIS — I517 Cardiomegaly: Secondary | ICD-10-CM

## 2012-08-29 LAB — URINALYSIS, ROUTINE W REFLEX MICROSCOPIC
Bilirubin Urine: NEGATIVE
Hgb urine dipstick: NEGATIVE
Ketones, ur: NEGATIVE mg/dL
Specific Gravity, Urine: 1.017 (ref 1.005–1.030)
Urobilinogen, UA: 0.2 mg/dL (ref 0.0–1.0)

## 2012-08-29 LAB — TSH: TSH: 2.449 u[IU]/mL (ref 0.350–4.500)

## 2012-08-29 LAB — LIPID PANEL
Cholesterol: 136 mg/dL (ref 0–200)
HDL: 33 mg/dL — ABNORMAL LOW (ref >39)
Total CHOL/HDL Ratio: 4.1 RATIO
Triglycerides: 109 mg/dL (ref <150)
VLDL: 22 mg/dL (ref 0–40)

## 2012-08-29 LAB — COMPREHENSIVE METABOLIC PANEL
AST: 20 U/L (ref 0–37)
Albumin: 3.1 g/dL — ABNORMAL LOW (ref 3.5–5.2)
Chloride: 103 mEq/L (ref 96–112)
Creatinine, Ser: 0.86 mg/dL (ref 0.50–1.35)
Potassium: 3.8 mEq/L (ref 3.5–5.1)
Total Bilirubin: 0.6 mg/dL (ref 0.3–1.2)
Total Protein: 5.7 g/dL — ABNORMAL LOW (ref 6.0–8.3)

## 2012-08-29 LAB — HEMOGLOBIN A1C: Mean Plasma Glucose: 120 mg/dL — ABNORMAL HIGH (ref <117)

## 2012-08-29 LAB — CBC
HCT: 31.8 % — ABNORMAL LOW (ref 39.0–52.0)
MCH: 26.8 pg (ref 26.0–34.0)
MCV: 83.5 fL (ref 78.0–100.0)
Platelets: 226 10*3/uL (ref 150–400)
RBC: 3.81 MIL/uL — ABNORMAL LOW (ref 4.22–5.81)
WBC: 9.2 10*3/uL (ref 4.0–10.5)

## 2012-08-29 LAB — PHOSPHORUS: Phosphorus: 3.8 mg/dL (ref 2.3–4.6)

## 2012-08-29 LAB — MAGNESIUM: Magnesium: 1.8 mg/dL (ref 1.5–2.5)

## 2012-08-29 MED ORDER — ENOXAPARIN SODIUM 40 MG/0.4ML ~~LOC~~ SOLN
40.0000 mg | SUBCUTANEOUS | Status: DC
  Administered 2012-08-29: 40 mg via SUBCUTANEOUS
  Filled 2012-08-29 (×2): qty 0.4

## 2012-08-29 MED ORDER — HYDROCODONE-ACETAMINOPHEN 5-325 MG PO TABS
1.0000 | ORAL_TABLET | ORAL | Status: DC | PRN
  Administered 2012-08-29 (×2): 2 via ORAL
  Filled 2012-08-29: qty 2

## 2012-08-29 MED ORDER — ACETAMINOPHEN 650 MG RE SUPP: 650.0000 mg | Freq: Four times a day (QID) | RECTAL | Status: DC | PRN

## 2012-08-29 MED ORDER — ONDANSETRON HCL 4 MG PO TABS: 4.0000 mg | ORAL_TABLET | Freq: Four times a day (QID) | ORAL | Status: DC | PRN

## 2012-08-29 MED ORDER — ACETAMINOPHEN 325 MG PO TABS: 650.0000 mg | ORAL_TABLET | Freq: Four times a day (QID) | ORAL | Status: DC | PRN

## 2012-08-29 MED ORDER — SODIUM CHLORIDE 0.9 % IV SOLN
INTRAVENOUS | Status: DC
  Administered 2012-08-29: 04:00:00 via INTRAVENOUS

## 2012-08-29 MED ORDER — SODIUM CHLORIDE 0.9 % IJ SOLN: 3.0000 mL | Freq: Two times a day (BID) | INTRAMUSCULAR | Status: DC

## 2012-08-29 MED ORDER — DOCUSATE SODIUM 100 MG PO CAPS
100.0000 mg | ORAL_CAPSULE | Freq: Two times a day (BID) | ORAL | Status: DC
  Administered 2012-08-29: 100 mg via ORAL
  Filled 2012-08-29 (×2): qty 1

## 2012-08-29 MED ORDER — OXYCODONE-ACETAMINOPHEN 7.5-325 MG PO TABS: 1.0000 | ORAL_TABLET | ORAL | Status: DC | PRN

## 2012-08-29 MED ORDER — LOSARTAN POTASSIUM 50 MG PO TABS
100.0000 mg | ORAL_TABLET | Freq: Every day | ORAL | Status: DC
  Administered 2012-08-29: 100 mg via ORAL
  Filled 2012-08-29: qty 2

## 2012-08-29 MED ORDER — ONDANSETRON HCL 4 MG/2ML IJ SOLN: 4.0000 mg | Freq: Four times a day (QID) | INTRAMUSCULAR | Status: DC | PRN

## 2012-08-29 NOTE — Progress Notes (Signed)
Patient discharged home with wife. Discharge instructions given and explained to patient/wife and they verbalized understanding, patient denies any distress. Skin intact but bruises from fall at home. Patient accompanied home by wife.

## 2012-08-29 NOTE — Discharge Summary (Signed)
Physician Discharge Summary  Melvin Hamilton ZOX:[id number] DOB: [birth year] DOA: 08/28/2012  PCP: Quitman Livings, MD  Admit date: 08/28/2012 Discharge date: 08/29/2012  Recommendations for Outpatient Follow-up:  1. Pt will need to follow up with PCP in 2-3 weeks post discharge 2. Please obtain BMP to evaluate electrolytes and kidney function 3. Please also check CBC to evaluate Hg and Hct levels  Discharge Diagnoses: Syncopal episode secondary to orthostatic hypotension  Active Problems:  Obstructive sleep apnea  Syncope and collapse  Fall from tree  Hypertension  Rib fracture  Discharge Condition: Stable  Diet recommendation: Heart healthy diet discussed in details   History of present illness:  Pt is 66 yo male with hypertension and sleep apnea, who presented to [facility] ED after sustaining fall from a tree. 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 He has never had anything like that happen before. He denies any specific prodromal symptoms. He denies shortness of breath, chest pain, focal neurologic deficits.   Hospital Course:  Active Problems:  Syncope and collapse - likely secondary to orthostatic hypotension - pt provided fluids and has responded well - CE x 3 are within normal limits - no events on telemetry, normal 2 D ECHO but indicative of chronic diastolic CHF grade II   Fall from tree - analgesia as needed   Hypertension - now stable and at target    Rib fracture - analgesia as needed   Procedures/Studies 08/28/2012  IMPRESSION:  No acute intracranial abnormality.   CT CERVICAL SPINE   IMPRESSION:  Degenerative changes.  No acute findings.    Dg Chest Port 1 View There are lateral rib fractures involving the left fourth, fifth and sixth ribs.  No pneumothorax.    08/28/2012   IMPRESSION:  No active cardiopulmonary disease.     Consultations:  none  Antibiotics:  none  Discharge Exam: Filed Vitals:    08/29/12 0700  BP: 136/54  Pulse: 60  Temp: 98.4 F (36.9 C)  Resp: 18   Filed Vitals:   08/28/12 2056 08/29/12 0100 08/29/12 0700  BP: 118/86 124/60 136/54  Pulse: 82 68 60  Temp: 97.6 F (36.4 C) 98.2 F (36.8 C) 98.4 F (36.9 C)  TempSrc: Oral Oral Oral  Resp: 18 22 18   Height:  5\' 8"  (1.727 m)   Weight:  101.5 kg (223 lb 12.3 oz)   SpO2: 96% 99% 99%   General: Pt is alert, follows commands appropriately, not in acute distress Cardiovascular: Regular rate and rhythm, S1/S2 +, no murmurs, no rubs, no gallops Respiratory: Clear to auscultation bilaterally, no wheezing, no crackles, no rhonchi Abdominal: Soft, non tender, non distended, bowel sounds +, no guarding Extremities: no edema, no cyanosis, pulses palpable bilaterally DP and PT Neuro: Grossly nonfocal  Discharge Instructions  Discharge Orders    Future Appointments: Provider: Department: Dept Phone: Center:   06/09/2013 1:30 PM Waymon Budge, MD [facility] [phone] None     Future Orders Please Complete By Expires   Diet - low sodium heart healthy      Increase activity slowly          Medication List     As of 08/29/2012  9:42 AM    TAKE these medications         losartan 100 MG tablet   Commonly known as: COZAAR   Take 100 mg by mouth daily.  oxyCODONE-acetaminophen 7.5-325 MG per tablet   Commonly known as: PERCOCET   Take 1 tablet by mouth every 4 (four) hours as needed for pain.           Follow-up Information    Follow up with [facility], MD. In 2 weeks.   Contact information:   [address] [place] [address]           The results of significant diagnostics from this hospitalization (including imaging, microbiology, ancillary and laboratory) are listed below for reference.     Microbiology: No results found for this or any previous visit (from the past 240 hour(s)).   Labs: Basic Metabolic Panel:  Lab 08/29/12 6045 08/28/12 2108  NA 137 134*   K 3.8 3.7  CL 103 99  CO2 26 24  GLUCOSE 96 131*  BUN 15 16  CREATININE 0.86 0.79  CALCIUM 8.2* 8.8  MG 1.8 --  PHOS 3.8 --   Liver Function Tests:  Lab 08/29/12 0537 08/28/12 2108  AST 20 28  ALT 16 22  ALKPHOS 61 72  BILITOT 0.6 0.4  PROT 5.7* 6.7  ALBUMIN 3.1* 3.8   CBC:  Lab 08/29/12 0537 08/28/12 2108  WBC 9.2 16.8*  NEUTROABS -- 15.4*  HGB 10.2* 11.8*  HCT 31.8* 35.6*  MCV 83.5 82.2  PLT 226 243   Cardiac Enzymes:  Lab 08/29/12 0537 08/28/12 2108  CKTOTAL -- --  CKMB -- --  CKMBINDEX -- --  TROPONINI <0.30 <0.30    SIGNED: Time coordinating discharge: Over 30 minutes  Debbora Presto, MD  [facility] 08/29/2012, 9:42 AM Pager [phone]  If 7PM-7AM, please contact night-coverage [email or web address] Password [email or web address]

## 2012-08-29 NOTE — Progress Notes (Signed)
  Echocardiogram 2D Echocardiogram has been performed.  Melvin Hamilton 08/29/2012, 9:09 AM

## 2012-08-29 NOTE — Progress Notes (Signed)
Utilization review completed.  

## 2012-08-29 NOTE — Progress Notes (Signed)
VASCULAR LAB PRELIMINARY  PRELIMINARY  PRELIMINARY  PRELIMINARY  Carotid Dopplers completed.    Preliminary report:  There is no significant ICA stenosis.  Vertebral artery flow is antegrade.  Melvin Hamilton, 08/29/2012, 9:33 AM

## 2012-09-30 ENCOUNTER — Telehealth: Payer: Self-pay | Admitting: Internal Medicine

## 2012-09-30 ENCOUNTER — Other Ambulatory Visit: Payer: Self-pay | Admitting: Internal Medicine

## 2012-09-30 MED ORDER — ZOLPIDEM TARTRATE 10 MG PO TABS
10.0000 mg | ORAL_TABLET | Freq: Every evening | ORAL | Status: DC | PRN
Start: 2012-09-30 — End: 2012-12-14

## 2012-09-30 NOTE — Telephone Encounter (Signed)
Per CY-okay to refill Ambien 10 mg #30 take 1 po qhs prn sleep with 5 refills.

## 2012-09-30 NOTE — Telephone Encounter (Signed)
lmom for pt to call back  On meds requested.

## 2012-09-30 NOTE — Telephone Encounter (Signed)
rx for the Melvin Hamilton has been called to the pharmacy.  i called and lmom to make the pt aware.

## 2012-09-30 NOTE — Telephone Encounter (Signed)
Dup message °

## 2012-09-30 NOTE — Telephone Encounter (Signed)
Per last OV note on 08/05/12, AVS stated pt was to have Ambien 10 mg rx but pt never received and this medication is not on pt's current medicaion list. CY, pls advise if okay to call in for the pt and # of refills to give. Thanks.

## 2012-10-05 ENCOUNTER — Telehealth: Payer: Self-pay | Admitting: Internal Medicine

## 2012-10-05 NOTE — Telephone Encounter (Signed)
Called and spoke with pharmacist at [facility] She states that request was already faxed Please advise if this has been received triage, thanks

## 2012-10-06 NOTE — Telephone Encounter (Signed)
Called for PA at [phone]. Member #[id number].Ambien 10 mg APPROVED from 08/06/2012 through 10/06/2013. Pharmacy and patient notified.

## 2012-12-07 ENCOUNTER — Other Ambulatory Visit: Payer: Self-pay | Admitting: Orthopedic Surgery

## 2012-12-07 MED ORDER — DEXAMETHASONE SODIUM PHOSPHATE 10 MG/ML IJ SOLN: 10.0000 mg | Freq: Once | INTRAMUSCULAR | Status: DC

## 2012-12-07 NOTE — Progress Notes (Signed)
Preoperative surgical orders have been place into the Epic hospital system for Melvin Hamilton on 12/07/2012, 5:48 PM  by Patrica Duel for surgery on 12/22/2012.  Preop Knee Scope orders including IV Tylenol and IV Decadron as long as there are no contraindications to the above medications. Avel Peace, PA-C

## 2012-12-14 ENCOUNTER — Encounter (HOSPITAL_COMMUNITY): Payer: Self-pay | Admitting: Pharmacy Technician

## 2012-12-15 NOTE — Patient Instructions (Addendum)
20 EINER MEALS  12/15/2012   Your procedure is scheduled on:  12/22/12  [facility]  Report to [facility] at  1015     AM.  Call this number if you have problems the morning of surgery: [phone]       Remember:   Do not eat food  Or drink :After Midnight.TUESDAY NIGHT   Take these medicines the morning of surgery with A SIP OF WATER:  Norco if needed   .  Contacts, dentures or partial plates can not be worn to surgery  Leave suitcase in the car. After surgery it may be brought to your room.  For patients admitted to the hospital, checkout time is 11:00 AM day of  discharge.             SPECIAL INSTRUCTIONS- SEE  PREPARING FOR SURGERY INSTRUCTION SHEET-     DO NOT WEAR JEWELRY, LOTIONS, POWDERS, OR PERFUMES.  WOMEN-- DO NOT SHAVE LEGS OR UNDERARMS FOR 12 HOURS BEFORE SHOWERS. MEN MAY SHAVE FACE.  Patients discharged the day of surgery will not be allowed to drive home. IF going home the day of surgery, you must have a driver and someone to stay with you for the first 24 hours  Name and phone number of your driver:    Wife Daenease                                                                    Please read over the following fact sheets that you were given: MRSA Information, Incentive Spirometry Sheet, Blood Transfusion Sheet  Information                                                                                   Aston Lawhorn  PST 336  [id number]                 FAILURE TO FOLLOW THESE INSTRUCTIONS MAY RESULT IN  CANCELLATION   OF YOUR SURGERY                                                  Patient Signature _____________________________

## 2012-12-15 NOTE — Progress Notes (Signed)
1 view chest, EKG, eccho, OV Dr Maple Hudson 11/13 EPIC

## 2012-12-16 ENCOUNTER — Encounter (HOSPITAL_COMMUNITY)
Admission: RE | Admit: 2012-12-16 | Discharge: 2012-12-16 | Disposition: A | Discharge status: Home / Self Care | Payer: Medicare Other | Source: Ambulatory Visit | Attending: Orthopedic Surgery | Admitting: Orthopedic Surgery

## 2012-12-16 ENCOUNTER — Encounter (HOSPITAL_COMMUNITY): Payer: Self-pay

## 2012-12-16 HISTORY — DX: Disease of blood and blood-forming organs, unspecified: D75.9

## 2012-12-16 LAB — BASIC METABOLIC PANEL
BUN: 14 mg/dL (ref 6–23)
Calcium: 8.9 mg/dL (ref 8.4–10.5)
Chloride: 105 mEq/L (ref 96–112)
Creatinine, Ser: 0.9 mg/dL (ref 0.50–1.35)
GFR calc Af Amer: >90 mL/min (ref >90)

## 2012-12-16 LAB — CBC
HCT: 35.3 % — ABNORMAL LOW (ref 39.0–52.0)
MCH: 27.9 pg (ref 26.0–34.0)
MCHC: 32 g/dL (ref 30.0–36.0)
MCV: 87.2 fL (ref 78.0–100.0)
Platelets: 226 10*3/uL (ref 150–400)
RDW: 15 % (ref 11.5–15.5)
WBC: 6.2 10*3/uL (ref 4.0–10.5)

## 2012-12-16 NOTE — Progress Notes (Signed)
Instructed to bring CPAP mask and tubing with him to hospital

## 2012-12-21 NOTE — H&P (Signed)
  CC- Melvin Hamilton is a 67 y.o. male who presents with left knee pain.  HPI- . Knee Pain: Patient presents with knee pain involving the  left knee. Onset of the symptoms was several months ago. Inciting event: fall. Current symptoms include giving out and pain located medially. Pain is aggravated by any weight bearing, lateral movements, pivoting, rising after sitting and squatting.  Patient has had no prior knee problems. Evaluation to date: MRI: abnormal MCL/PCL/medial meniscal tear. Treatment to date: brace which is somewhat effective.  Past Medical History  Diagnosis Date  . Hypertension   . H/O sleep apnea   . Dermatitis   . Acne   . Blood dyscrasia 2003    hemochromatosis    Past Surgical History  Procedure Laterality Date  . Rotator cuff repair      x 2  . Cataract extraction    . Tonsillectomy    . Carpal tunnel release      right  . Back surgery      lumbar  . Percutaneous liver biopsy      Prior to Admission medications   Medication Sig Start Date End Date Taking? Authorizing Provider  ALPRAZolam Prudy Feeler) 1 MG tablet Take 1 mg by mouth at bedtime as needed for sleep.    Historical Provider, MD  HYDROcodone-acetaminophen (NORCO/VICODIN) 5-325 MG per tablet Take 1 tablet by mouth 4 (four) times daily as needed for pain.    Historical Provider, MD  losartan (COZAAR) 100 MG tablet Take 100 mg by mouth daily before breakfast.     Historical Provider, MD  naproxen (NAPROSYN) 500 MG tablet Take 500 mg by mouth 2 (two) times daily as needed.     Historical Provider, MD   KNEE EXAM antalgic gait, soft tissue tenderness over medial joint line, effusion, negative pivot-shift, ligamentous instability medially  Physical Examination: General appearance - alert, well appearing, and in no distress Mental status - alert, oriented to person, place, and time Chest - clear to auscultation, no wheezes, rales or rhonchi, symmetric air entry Heart - normal rate, regular rhythm, normal S1,  S2, no murmurs, rubs, clicks or gallops Abdomen - soft, nontender, nondistended, no masses or organomegaly Neurological - alert, oriented, normal speech, no focal findings or movement disorder noted   Asessment/Plan--- Left knee medial meniscal tear- - Plan left knee arthroscopy with meniscal debridement. Procedure risks and potential comps discussed with patient who elects to proceed. Goals are decreased pain and increased function with a high likelihood of achieving both

## 2012-12-22 ENCOUNTER — Encounter (HOSPITAL_COMMUNITY): Payer: Self-pay | Admitting: *Deleted

## 2012-12-22 ENCOUNTER — Encounter (HOSPITAL_COMMUNITY)
Admission: RE | Disposition: A | Discharge status: Home / Self Care | Payer: Self-pay | Source: Ambulatory Visit | Attending: Orthopedic Surgery

## 2012-12-22 ENCOUNTER — Ambulatory Visit (HOSPITAL_COMMUNITY)
Admission: RE | Admit: 2012-12-22 | Discharge: 2012-12-22 | Disposition: A | Discharge status: Home / Self Care | Payer: Medicare Other | Source: Ambulatory Visit | Attending: Orthopedic Surgery | Admitting: Orthopedic Surgery

## 2012-12-22 ENCOUNTER — Ambulatory Visit (HOSPITAL_COMMUNITY): Payer: Medicare Other | Admitting: *Deleted

## 2012-12-22 DIAGNOSIS — IMO0002 Reserved for concepts with insufficient information to code with codable children: Secondary | ICD-10-CM | POA: Insufficient documentation

## 2012-12-22 DIAGNOSIS — Z01812 Encounter for preprocedural laboratory examination: Secondary | ICD-10-CM | POA: Insufficient documentation

## 2012-12-22 DIAGNOSIS — W19XXXA Unspecified fall, initial encounter: Secondary | ICD-10-CM | POA: Insufficient documentation

## 2012-12-22 DIAGNOSIS — I10 Essential (primary) hypertension: Secondary | ICD-10-CM | POA: Insufficient documentation

## 2012-12-22 DIAGNOSIS — S83249A Other tear of medial meniscus, current injury, unspecified knee, initial encounter: Secondary | ICD-10-CM

## 2012-12-22 DIAGNOSIS — S83242A Other tear of medial meniscus, current injury, left knee, initial encounter: Secondary | ICD-10-CM

## 2012-12-22 DIAGNOSIS — Z79899 Other long term (current) drug therapy: Secondary | ICD-10-CM | POA: Insufficient documentation

## 2012-12-22 DIAGNOSIS — G4730 Sleep apnea, unspecified: Secondary | ICD-10-CM | POA: Insufficient documentation

## 2012-12-22 HISTORY — PX: KNEE ARTHROSCOPY: SHX127

## 2012-12-22 SURGERY — ARTHROSCOPY, KNEE
Anesthesia: General | Site: Knee | Laterality: Left | Wound class: Clean

## 2012-12-22 MED ORDER — CEFAZOLIN SODIUM-DEXTROSE 2-3 GM-% IV SOLR
INTRAVENOUS | Status: AC
  Filled 2012-12-22: qty 50

## 2012-12-22 MED ORDER — MIDAZOLAM HCL 5 MG/5ML IJ SOLN
INTRAMUSCULAR | Status: DC | PRN
  Administered 2012-12-22: 2 mg via INTRAVENOUS

## 2012-12-22 MED ORDER — METHOCARBAMOL 500 MG PO TABS: 500.0000 mg | ORAL_TABLET | Freq: Four times a day (QID) | ORAL | Status: DC

## 2012-12-22 MED ORDER — STERILE WATER FOR IRRIGATION IR SOLN
Status: DC | PRN
  Administered 2012-12-22: 500 mL

## 2012-12-22 MED ORDER — LABETALOL HCL 5 MG/ML IV SOLN
INTRAVENOUS | Status: DC | PRN
  Administered 2012-12-22: 2.5 mg via INTRAVENOUS

## 2012-12-22 MED ORDER — LACTATED RINGERS IV SOLN
INTRAVENOUS | Status: DC | PRN
  Administered 2012-12-22: 13:00:00 via INTRAVENOUS

## 2012-12-22 MED ORDER — LIDOCAINE HCL 1 % IJ SOLN
INTRAMUSCULAR | Status: DC | PRN
  Administered 2012-12-22: 50 mg via INTRADERMAL

## 2012-12-22 MED ORDER — EPHEDRINE SULFATE 50 MG/ML IJ SOLN
INTRAMUSCULAR | Status: DC | PRN
  Administered 2012-12-22: 10 mg via INTRAVENOUS

## 2012-12-22 MED ORDER — OXYCODONE-ACETAMINOPHEN 5-325 MG PO TABS: 1.0000 | ORAL_TABLET | ORAL | Status: DC | PRN

## 2012-12-22 MED ORDER — BUPIVACAINE-EPINEPHRINE 0.25% -1:200000 IJ SOLN
INTRAMUSCULAR | Status: AC
  Filled 2012-12-22: qty 1

## 2012-12-22 MED ORDER — PROPOFOL 10 MG/ML IV BOLUS
INTRAVENOUS | Status: DC | PRN
  Administered 2012-12-22: 200 mg via INTRAVENOUS

## 2012-12-22 MED ORDER — PROMETHAZINE HCL 25 MG/ML IJ SOLN: 6.2500 mg | INTRAMUSCULAR | Status: DC | PRN

## 2012-12-22 MED ORDER — HYDROMORPHONE HCL PF 1 MG/ML IJ SOLN
0.2500 mg | INTRAMUSCULAR | Status: DC | PRN
  Administered 2012-12-22 (×2): 0.5 mg via INTRAVENOUS

## 2012-12-22 MED ORDER — ACETAMINOPHEN 10 MG/ML IV SOLN
INTRAVENOUS | Status: AC
  Filled 2012-12-22: qty 100

## 2012-12-22 MED ORDER — HYDROMORPHONE HCL PF 1 MG/ML IJ SOLN
INTRAMUSCULAR | Status: AC
  Filled 2012-12-22: qty 1

## 2012-12-22 MED ORDER — GLYCOPYRROLATE 0.2 MG/ML IJ SOLN
INTRAMUSCULAR | Status: DC | PRN
  Administered 2012-12-22: 0.2 mg via INTRAVENOUS

## 2012-12-22 MED ORDER — BUPIVACAINE-EPINEPHRINE PF 0.25-1:200000 % IJ SOLN
INTRAMUSCULAR | Status: AC
  Filled 2012-12-22: qty 30

## 2012-12-22 MED ORDER — ONDANSETRON HCL 4 MG/2ML IJ SOLN
INTRAMUSCULAR | Status: DC | PRN
  Administered 2012-12-22: 4 mg via INTRAVENOUS

## 2012-12-22 MED ORDER — LACTATED RINGERS IV SOLN: INTRAVENOUS | Status: DC

## 2012-12-22 MED ORDER — SODIUM CHLORIDE 0.9 % IV SOLN: INTRAVENOUS | Status: DC

## 2012-12-22 MED ORDER — LACTATED RINGERS IR SOLN
Status: DC | PRN
  Administered 2012-12-22: 9000 mL

## 2012-12-22 MED ORDER — ACETAMINOPHEN 10 MG/ML IV SOLN
1000.0000 mg | Freq: Once | INTRAVENOUS | Status: AC
  Administered 2012-12-22: 1000 mg via INTRAVENOUS

## 2012-12-22 MED ORDER — BUPIVACAINE-EPINEPHRINE 0.25% -1:200000 IJ SOLN
INTRAMUSCULAR | Status: DC | PRN
  Administered 2012-12-22: 30 mL

## 2012-12-22 MED ORDER — FENTANYL CITRATE 0.05 MG/ML IJ SOLN
INTRAMUSCULAR | Status: DC | PRN
  Administered 2012-12-22: 100 ug via INTRAVENOUS
  Administered 2012-12-22 (×2): 25 ug via INTRAVENOUS
  Administered 2012-12-22: 50 ug via INTRAVENOUS

## 2012-12-22 MED ORDER — CEFAZOLIN SODIUM-DEXTROSE 2-3 GM-% IV SOLR
2.0000 g | INTRAVENOUS | Status: AC
  Administered 2012-12-22: 2 g via INTRAVENOUS

## 2012-12-22 SURGICAL SUPPLY — 25 items
BANDAGE ELASTIC 6 VELCRO ST LF (GAUZE/BANDAGES/DRESSINGS) ×1 IMPLANT
BLADE 4.2CUDA (BLADE) ×2 IMPLANT
CLOTH BEACON ORANGE TIMEOUT ST (SAFETY) ×2 IMPLANT
CUFF TOURN SGL QUICK 34 (TOURNIQUET CUFF) ×2
CUFF TRNQT CYL 34X4X40X1 (TOURNIQUET CUFF) ×1 IMPLANT
DRAPE U-SHAPE 47X51 STRL (DRAPES) ×2 IMPLANT
DRSG EMULSION OIL 3X3 NADH (GAUZE/BANDAGES/DRESSINGS) ×2 IMPLANT
DRSG PAD ABDOMINAL 8X10 ST (GAUZE/BANDAGES/DRESSINGS) ×1 IMPLANT
DURAPREP 26ML APPLICATOR (WOUND CARE) ×2 IMPLANT
GLOVE BIO SURGEON STRL SZ8 (GLOVE) ×2 IMPLANT
GLOVE BIOGEL PI IND STRL 8 (GLOVE) ×1 IMPLANT
GLOVE BIOGEL PI INDICATOR 8 (GLOVE) ×1
GOWN STRL NON-REIN LRG LVL3 (GOWN DISPOSABLE) ×3 IMPLANT
MANIFOLD NEPTUNE II (INSTRUMENTS) ×3 IMPLANT
PACK ARTHROSCOPY WL (CUSTOM PROCEDURE TRAY) ×2 IMPLANT
PACK ICE MAXI GEL EZY WRAP (MISCELLANEOUS) ×6 IMPLANT
PADDING CAST COTTON 6X4 STRL (CAST SUPPLIES) ×3 IMPLANT
POSITIONER SURGICAL ARM (MISCELLANEOUS) ×2 IMPLANT
SET ARTHROSCOPY TUBING (MISCELLANEOUS) ×2
SET ARTHROSCOPY TUBING LN (MISCELLANEOUS) ×1 IMPLANT
SPONGE GAUZE 4X4 12PLY (GAUZE/BANDAGES/DRESSINGS) ×1 IMPLANT
SUT ETHILON 4 0 PS 2 18 (SUTURE) ×2 IMPLANT
TOWEL OR 17X26 10 PK STRL BLUE (TOWEL DISPOSABLE) ×2 IMPLANT
WAND 90 DEG TURBOVAC W/CORD (SURGICAL WAND) ×2 IMPLANT
WRAP KNEE MAXI GEL POST OP (GAUZE/BANDAGES/DRESSINGS) ×4 IMPLANT

## 2012-12-22 NOTE — Anesthesia Preprocedure Evaluation (Signed)
Anesthesia Evaluation  Patient identified by MRN, date of birth, ID band Patient awake    Reviewed: Allergy & Precautions, H&P , NPO status , Patient's Chart, lab work & pertinent test results  Airway Mallampati: III TM Distance: >3 FB Neck ROM: Full    Dental  (+) Teeth Intact, Poor Dentition, Chipped, Missing and Dental Advisory Given   Pulmonary sleep apnea and Continuous Positive Airway Pressure Ventilation ,  breath sounds clear to auscultation  Pulmonary exam normal       Cardiovascular hypertension, Rhythm:Regular Rate:Normal     Neuro/Psych negative neurological ROS  negative psych ROS   GI/Hepatic negative GI ROS, Neg liver ROS,   Endo/Other  negative endocrine ROS  Renal/GU negative Renal ROS  negative genitourinary   Musculoskeletal negative musculoskeletal ROS (+)   Abdominal   Peds negative pediatric ROS (+)  Hematology  (+) Blood dyscrasia, ,   Anesthesia Other Findings   Reproductive/Obstetrics negative OB ROS                           Anesthesia Physical Anesthesia Plan  ASA: II  Anesthesia Plan: General   Post-op Pain Management:    Induction: Intravenous  Airway Management Planned: LMA  Additional Equipment:   Intra-op Plan:   Post-operative Plan: Extubation in OR  Informed Consent: I have reviewed the patients History and Physical, chart, labs and discussed the procedure including the risks, benefits and alternatives for the proposed anesthesia with the patient or authorized representative who has indicated his/her understanding and acceptance.   Dental advisory given  Plan Discussed with: CRNA  Anesthesia Plan Comments:         Anesthesia Quick Evaluation

## 2012-12-22 NOTE — Op Note (Signed)
Preoperative diagnosis-  Left knee medial meniscal tear  Postoperative diagnosis Left- knee medial meniscal tear   Procedure- Left knee arthroscopy with medial  Meniscal debridement   Surgeon- Gus Rankin. Nissi Doffing, MD  Anesthesia-General  EBL-  minimal Complications- None  Condition- PACU - hemodynamically stable.  Brief clinical note- -Melvin Hamilton is a 67 y.o.  male with a several month history of left knee pain post-fall and mechanical symptoms. Exam and history suggested medial meniscal tear confirmed by MRI. The patient presents now for arthroscopy and debridement   Procedure in detail -       After successful administration of General anesthetic, a tourmiquet is placed high on the Left  thigh and the Left lower extremity is prepped and draped in the usual sterile fashion. Time out is performed by the surgical team. Standard superomedial and inferolateral portal sites are marked and incisions made with an 11 blade. The inflow cannula is passed through the superomedial portal and camera through the inferolateral portal and inflow is initiated. Arthroscopic visualization proceeds.      The undersurface of the patella and trochlea are visualized and they have minimal chondromalacia. The medial and lateral gutters are visualized and there are  no loose bodies. Flexion and valgus force is applied to the knee and the medial compartment is entered. A spinal needle is passed into the joint through the site marked for the inferomedial portal. A small incision is made and the dilator passed into the joint. The findings for the medial compartment are significant displaced tear of the body and posterior horn of the medial meniscus . The tear is debrided to a stable base with baskets and a shaver and sealed off with the Arthrocare. There are no chondral defects.    The intercondylar notch is visualized and the ACL appears attenuated but intact. The lateral compartment is entered and the findings are  normal .      The joint is again inspected and there are no other tears, defects or loose bodies identified. The arthroscopic equipment is then removed from the inferior portals which are closed with interrupted 4-0 nylon. 20 ml of .25% Marcaine with epinephrine are injected through the inflow cannula and the cannula is then removed and the portal closed with nylon. The incisions are cleaned and dried and a bulky sterile dressing is applied. The patient is then awakened and transported to recovery in stable condition.   12/22/2012, 2:19 PM

## 2012-12-22 NOTE — Transfer of Care (Signed)
Immediate Anesthesia Transfer of Care Note  Patient: Melvin Hamilton  Procedure(s) Performed: Procedure(s): ARTHROSCOPY KNEE WITH MEDIAL MENISCUS WITH DEBRIDEMENT (Left)  Patient Location: PACU  Anesthesia Type:General  Level of Consciousness: awake, alert , oriented and patient cooperative  Airway & Oxygen Therapy: Patient Spontanous Breathing and Patient connected to face mask oxygen  Post-op Assessment: Report given to PACU RN, Post -op Vital signs reviewed and stable and Patient moving all extremities  Post vital signs: Reviewed and stable  Complications: No apparent anesthesia complications

## 2012-12-22 NOTE — Interval H&P Note (Signed)
History and Physical Interval Note:  12/22/2012 1:23 PM  Melvin Hamilton  has presented today for surgery, with the diagnosis of LEFT KNEE MEDIAL MENISCUS TEAR  The various methods of treatment have been discussed with the patient and family. After consideration of risks, benefits and other options for treatment, the patient has consented to  Procedure(s) with comments: ARTHROSCOPY KNEE (Left) - WITH DEBRIDEMENT as a surgical intervention .  The patient's history has been reviewed, patient examined, no change in status, stable for surgery.  I have reviewed the patient's chart and labs.  Questions were answered to the patient's satisfaction.     Loanne Drilling

## 2012-12-22 NOTE — Anesthesia Postprocedure Evaluation (Signed)
Anesthesia Post Note  Patient: Melvin Hamilton  Procedure(s) Performed: Procedure(s) (LRB): ARTHROSCOPY KNEE WITH MEDIAL MENISCUS WITH DEBRIDEMENT (Left)  Anesthesia type: General  Patient location: PACU  Post pain: Pain level controlled  Post assessment: Post-op Vital signs reviewed  Last Vitals:  Filed Vitals:   12/22/12 1445  BP: 155/81  Pulse: 72  Temp:   Resp: 18    Post vital signs: Reviewed  Level of consciousness: sedated  Complications: No apparent anesthesia complications

## 2012-12-23 ENCOUNTER — Encounter (HOSPITAL_COMMUNITY): Payer: Self-pay | Admitting: Orthopedic Surgery

## 2013-05-21 ENCOUNTER — Other Ambulatory Visit: Payer: Self-pay | Admitting: Internal Medicine

## 2013-05-23 NOTE — Telephone Encounter (Signed)
Please advise if okay to refill. Thanks.  

## 2013-05-23 NOTE — Telephone Encounter (Signed)
Ok to refill 

## 2013-06-09 ENCOUNTER — Ambulatory Visit (INDEPENDENT_AMBULATORY_CARE_PROVIDER_SITE_OTHER): Payer: Medicare Other | Admitting: Internal Medicine

## 2013-06-09 ENCOUNTER — Encounter: Payer: Self-pay | Admitting: Internal Medicine

## 2013-06-09 VITALS — BP 124/82 | HR 89 | Ht 68.0 in | Wt 223.0 lb

## 2013-06-09 DIAGNOSIS — G4733 Obstructive sleep apnea (adult) (pediatric): Secondary | ICD-10-CM

## 2013-06-09 NOTE — Progress Notes (Signed)
06/01/12- 66yoM referred by Dr Glean Salen for complaint of snoring with remote history of sleep apnea. He had a sleep study in the 1990s but never got CPAP. Evaluated by Dr. Macie Burows for occasional nasal stuffiness. He denies daytime sleepiness. ENT surgery limited to tonsils. 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 Never smoked. Family history negative for sleep apnea but positive for myocardial infarction. Bedtime between 10 and 12 PM estimating sleep latency 45-60 minutes, waking 2-3 times during the night before up at 8 AM.  08/05/12-  66yoM referred by Dr Glean Salen for complaint of snoring with remote history of sleep apnea. He had a sleep study in the 1990s but never got CPAP. Unattended [place] [facility]) home sleep study on 06/01/2012-AHI 16 per hour , moderately severe obstructive sleep apnea. We got him started with CPAP/ [employer] and are pending download report. He has been using all night every night and doing well. He is satisfied except for some difficulty initiating sleep. We discussed sleep hygiene and management of an insomnia component  06/09/13-  66yoM referred by Dr Glean Salen for complaint of snoring with remote history of sleep apnea.  FOLLOWS FOR: Currently using CPAP auto/ [employer] every night for at least 8 hours. Feels well rested the next day after use. Denies problems with the machine, mask or pressure. He uses CPAP every night and clearly feels better, sleeping better. Pain in his knees disturbs sleep.  ROS-see HPI Constitutional:   No-   weight loss, night sweats, fevers, chills, fatigue, lassitude. HEENT:   No-  headaches, difficulty swallowing, tooth/dental problems, sore throat,       No-  sneezing, itching, ear ache, +nasal congestion, post nasal drip,  CV:  No-   chest pain, orthopnea, PND, swelling in lower extremities, anasarca,  dizziness, palpitations Resp: No-   shortness of breath with  exertion or at rest.              No-   productive cough,  No non-productive cough,  No- coughing up of blood.              No-   change in color of mucus.  No- wheezing.   Skin: No-   rash or lesions. GI:  No-   heartburn, indigestion, abdominal pain, nausea, vomiting,  GU:  MS:  + joint pain or swelling.   Neuro-     nothing unusual Psych:  No- change in mood or affect. No depression or anxiety.  No memory loss.  OBJ- Physical Exam General- Alert, Oriented, Affect-guarded/ surly?, Distress- none acute, stocky body build Skin- rash-none, lesions- none, excoriation- none Lymphadenopathy- none Head- atraumatic            Eyes- Gross vision intact, PERRLA, conjunctivae and secretions clear            Ears- Hearing, canals-normal            Nose- Clear, no-Septal dev, mucus, polyps, erosion, perforation             Throat- Mallampati III , mucosa clear , drainage- none, tonsils- atrophic, few missing teeth Neck- flexible , trachea midline, no stridor , thyroid nl, carotid no bruit Chest - symmetrical excursion , unlabored           Heart/CV- RRR , no murmur , no gallop  , no rub, nl s1 s2                           -  JVD- none , edema- none, stasis changes- none, varices- none           Lung- clear to P&A, wheeze- none, cough- none , dullness-none, rub- none           Chest wall-  Abd-  Br/ Gen/ Rectal- Not done, not indicated Extrem- cyanosis- none, clubbing, none, atrophy- none, strength- nl Neuro- grossly intact to observation

## 2013-06-09 NOTE — Patient Instructions (Addendum)
Order- DME Christoper Allegra download for pressure recommendation. We will consider trying change to a fixed pressure setting.    Please call as needed

## 2013-06-10 ENCOUNTER — Other Ambulatory Visit: Payer: Self-pay | Admitting: Orthopedic Surgery

## 2013-06-20 ENCOUNTER — Encounter (HOSPITAL_COMMUNITY): Payer: Self-pay | Admitting: Pharmacy Technician

## 2013-06-22 ENCOUNTER — Encounter (HOSPITAL_COMMUNITY): Payer: Self-pay

## 2013-06-22 ENCOUNTER — Encounter (HOSPITAL_COMMUNITY)
Admission: RE | Admit: 2013-06-22 | Discharge: 2013-06-22 | Disposition: A | Discharge status: Home / Self Care | Payer: Medicare Other | Source: Ambulatory Visit | Attending: Orthopedic Surgery | Admitting: Orthopedic Surgery

## 2013-06-22 DIAGNOSIS — Z01812 Encounter for preprocedural laboratory examination: Secondary | ICD-10-CM | POA: Insufficient documentation

## 2013-06-22 DIAGNOSIS — M1710 Unilateral primary osteoarthritis, unspecified knee: Secondary | ICD-10-CM | POA: Insufficient documentation

## 2013-06-22 HISTORY — DX: Unspecified osteoarthritis, unspecified site: M19.90

## 2013-06-22 HISTORY — DX: Sleep apnea, unspecified: G47.30

## 2013-06-22 LAB — URINALYSIS, ROUTINE W REFLEX MICROSCOPIC
Ketones, ur: NEGATIVE mg/dL
Leukocytes, UA: NEGATIVE
Nitrite: NEGATIVE
Specific Gravity, Urine: 1.012 (ref 1.005–1.030)
pH: 6.5 (ref 5.0–8.0)

## 2013-06-22 LAB — COMPREHENSIVE METABOLIC PANEL
ALT: 18 U/L (ref 0–53)
AST: 18 U/L (ref 0–37)
Albumin: 3.9 g/dL (ref 3.5–5.2)
Calcium: 9.2 mg/dL (ref 8.4–10.5)
Creatinine, Ser: 0.86 mg/dL (ref 0.50–1.35)
GFR calc non Af Amer: 86 mL/min — ABNORMAL LOW (ref >90)
Sodium: 138 mEq/L (ref 135–145)
Total Protein: 7.1 g/dL (ref 6.0–8.3)

## 2013-06-22 LAB — CBC
MCH: 28.8 pg (ref 26.0–34.0)
MCHC: 32.8 g/dL (ref 30.0–36.0)
MCV: 87.9 fL (ref 78.0–100.0)
Platelets: 243 10*3/uL (ref 150–400)
RDW: 15.4 % (ref 11.5–15.5)

## 2013-06-22 LAB — SURGICAL PCR SCREEN: MRSA, PCR: NEGATIVE

## 2013-06-22 LAB — PROTIME-INR: INR: 1.02 (ref 0.00–1.49)

## 2013-06-22 NOTE — Pre-Procedure Instructions (Signed)
EKG AND CXR REPORTS  IN EPIC FROM 08/28/12.

## 2013-06-22 NOTE — Patient Instructions (Signed)
YOUR SURGERY IS SCHEDULED AT [facility]  ON:   Monday  9/8  REPORT TO [place] [facility] AT:  7:30 AM       PHONE # FOR SHORT STAY IS [phone]  DO NOT EAT OR DRINK ANYTHING AFTER MIDNIGHT THE NIGHT BEFORE YOUR SURGERY.  YOU MAY BRUSH YOUR TEETH, RINSE OUT YOUR MOUTH--BUT NO WATER, NO FOOD, NO CHEWING GUM, NO MINTS, NO CANDIES, NO CHEWING TOBACCO.  PLEASE TAKE THE FOLLOWING MEDICATIONS THE AM OF YOUR SURGERY WITH A FEW SIPS OF WATER:  No meds to take    IF YOU HAVE SLEEP APNEA AND USE CPAP OR BIPAP--PLEASE BRING THE MASK AND THE TUBING.  DO NOT BRING YOUR MACHINE.  DO NOT BRING VALUABLES, MONEY, CREDIT CARDS.  DO NOT WEAR JEWELRY, MAKE-UP, NAIL POLISH AND NO METAL PINS OR CLIPS IN YOUR HAIR. CONTACT LENS, DENTURES / PARTIALS, GLASSES SHOULD NOT BE WORN TO SURGERY AND IN MOST CASES-HEARING AIDS WILL NEED TO BE REMOVED.  BRING YOUR GLASSES CASE, ANY EQUIPMENT NEEDED FOR YOUR CONTACT LENS. FOR PATIENTS ADMITTED TO THE HOSPITAL--CHECK OUT TIME THE DAY OF DISCHARGE IS 11:00 AM.  ALL INPATIENT ROOMS ARE PRIVATE - WITH BATHROOM, TELEPHONE, TELEVISION AND WIFI INTERNET.                               PLEASE READ OVER ANY  FACT SHEETS THAT YOU WERE GIVEN: MRSA INFORMATION, BLOOD TRANSFUSION INFORMATION, INCENTIVE SPIROMETER INFORMATION. FAILURE TO FOLLOW THESE INSTRUCTIONS MAY RESULT IN THE CANCELLATION OF YOUR SURGERY.   PATIENT SIGNATURE_________________________________

## 2013-06-27 NOTE — Assessment & Plan Note (Signed)
We will get a new download for pressure recommendation and consider fixed pressure instead of autotitration

## 2013-07-03 ENCOUNTER — Other Ambulatory Visit: Payer: Self-pay | Admitting: Orthopedic Surgery

## 2013-07-03 NOTE — H&P (Signed)
Melvin Hamilton  DOB: 07/26/1946 Married / Language: English / Race: White Male  Date of Admission:  07/04/2013  Chief Complaint:  Left Knee Pain  History of Present Illness The patient is a 67 year old male who comes in for a preoperative History and Physical. The patient is scheduled for a left total knee arthroplasty to be performed by Dr. Frank V. Aluisio, MD at  Hospital on 07/04/2013. The patient is a 67 year old male who presents today for follow up of their knee. The patient is being followed for their left knee pain and osteoarthritis. They are now several week(s) out from Synvisc series. Symptoms reported today include: pain, swelling and aching. The patient feels that they are doing poorly and report their pain level to be severe. The following medication has been used for pain control: Hydrocodone (at night). The patient has not gotten any relief of their symptoms with viscosupplementation. He continues with significant pain and dysfunction in the left knee. He had area of bone on bone in his medial compartment where we did the scope. He unfortunately did not have response to cortisone or to visco supplement injections. He is ready to proceed with surgery. They have been treated conservatively in the past for the above stated problem and despite conservative measures, they continue to have progressive pain and severe functional limitations and dysfunction. They have failed non-operative management including home exercise, medications, and injections. It is felt that they would benefit from undergoing total joint replacement. Risks and benefits of the procedure have been discussed with the patient and they elect to proceed with surgery. There are no active contraindications to surgery such as ongoing infection or rapidly progressive neurological disease.  Problem List Primary osteoarthritis of one knee (715.16)   Allergies No Known Drug Allergies.  10/15/2012   Family History Congestive Heart Failure. father and brother Heart Disease. father and brother Cancer. brother   Social History Living situation. live with spouse Marital status. married Illicit drug use. no Drug/Alcohol Rehab (Previously). no Exercise. Exercises never Pain Contract. no Tobacco / smoke exposure. no Tobacco use. never smoker Current work status. retired Drug/Alcohol Rehab (Currently). no Children. 2 Alcohol use. current drinker; drinks hard liquor; less than 5 per week Post-Surgical Plans. Plan is to go home.   Medication History Losartan Potassium (100MG Tablet, Oral) Active. Norco (5-325MG Tablet, 1 Oral at bedtime, as needed) Active. Naproxen (500MG Tablet, Oral) Active. Zolpidem Tartrate (10MG Tablet, Oral) Active.   Past Surgical History Spinal Surgery Tonsillectomy   Medical History Sleep Apnea. uses CPAP Cataract   Review of Systems General:Not Present- Chills, Fever, Night Sweats, Fatigue, Weight Gain, Weight Loss and Memory Loss. Skin:Not Present- Hives, Itching, Rash, Eczema and Lesions. HEENT:Not Present- Tinnitus, Headache, Double Vision, Visual Loss, Hearing Loss and Dentures. Respiratory:Not Present- Shortness of breath with exertion, Shortness of breath at rest, Allergies, Coughing up blood and Chronic Cough. Cardiovascular:Not Present- Chest Pain, Racing/skipping heartbeats, Difficulty Breathing Lying Down, Murmur, Swelling and Palpitations. Gastrointestinal:Not Present- Bloody Stool, Heartburn, Abdominal Pain, Vomiting, Nausea, Constipation, Diarrhea, Difficulty Swallowing, Jaundice and Loss of appetitie. Male Genitourinary:Not Present- Urinary frequency, Blood in Urine, Weak urinary stream, Discharge, Flank Pain, Incontinence, Painful Urination, Urgency, Urinary Retention and Urinating at Night. Musculoskeletal:Present- Joint Swelling and Joint Pain. Not Present- Muscle Weakness, Muscle  Pain, Back Pain, Morning Stiffness and Spasms. Neurological:Not Present- Tremor, Dizziness, Blackout spells, Paralysis, Difficulty with balance and Weakness. Psychiatric:Not Present- Insomnia.   Vitals Weight: 220 lb Weight was reported by   patient. Pulse: 64 (Regular) Resp.: 14 (Unlabored) BP: 124/76 (Sitting, Right Arm, Standard)    Physical Exam The physical exam findings are as follows:   General Mental Status - Alert, cooperative and good historian. General Appearance- pleasant. Not in acute distress. Orientation- Oriented X3. Build & Nutrition- Well nourished and Well developed.   Head and Neck Head- normocephalic, atraumatic . Neck Global Assessment- supple. no bruit auscultated on the right and no bruit auscultated on the left.   Eye Pupil- Bilateral- Regular and Round. Motion- Bilateral- EOMI.   Chest and Lung Exam Auscultation: Breath sounds:- clear at anterior chest wall and - clear at posterior chest wall. Adventitious sounds:- No Adventitious sounds.   Cardiovascular Auscultation:Rhythm- Regular rate and rhythm. Heart Sounds- S1 WNL and S2 WNL. Murmurs & Other Heart Sounds:Auscultation of the heart reveals - No Murmurs.   Abdomen Palpation/Percussion:Tenderness- Abdomen is non-tender to palpation. Rigidity (guarding)- Abdomen is soft. Auscultation:Auscultation of the abdomen reveals - Bowel sounds normal.   Male Genitourinary Not done, not pertinent to present illness  Musculoskeletal On exam he is alert and oriented in no apparent distress. Evaluation of his left knee shows slight effusion. His range is about 5 to 125. He is very tender along the medial aspect of the knee. There is no lateral tenderness. He still has some laxity with valgus stressing.  RADIOGRAPHS: R-rays taken today AP both knees and lateral. He is now bone on bone in the medial compartment with significant patellofemoral narrowing  also.  Assessment & Plan Primary osteoarthritis of one knee (715.16) Impression: Left Knee  Note: Plan is for a Left Total Knee Replacement by Dr. Aluisio.  Plan is to go home.  PCP - Dr. Hassan  The patient does not have any contraindications and will recieve TXA (tranexamic acid) prior to surgery.  Time spent ~ 20 mins  Signed electronically by Alexzandrew L Perkins, III PA-C 

## 2013-07-04 ENCOUNTER — Encounter (HOSPITAL_COMMUNITY)
Admission: RE | Disposition: A | Discharge status: Home Health | Payer: Self-pay | Source: Ambulatory Visit | Attending: Orthopedic Surgery

## 2013-07-04 ENCOUNTER — Inpatient Hospital Stay (HOSPITAL_COMMUNITY)
Admission: RE | Admit: 2013-07-04 | Discharge: 2013-07-05 | DRG: 470 | Disposition: A | Discharge status: Home Health | Payer: Medicare Other | Source: Ambulatory Visit | Attending: Orthopedic Surgery | Admitting: Orthopedic Surgery

## 2013-07-04 ENCOUNTER — Inpatient Hospital Stay (HOSPITAL_COMMUNITY): Payer: Medicare Other | Admitting: Anesthesiology

## 2013-07-04 ENCOUNTER — Encounter (HOSPITAL_COMMUNITY): Payer: Self-pay

## 2013-07-04 ENCOUNTER — Encounter (HOSPITAL_COMMUNITY): Payer: Self-pay | Admitting: Anesthesiology

## 2013-07-04 DIAGNOSIS — I10 Essential (primary) hypertension: Secondary | ICD-10-CM | POA: Diagnosis present

## 2013-07-04 DIAGNOSIS — E871 Hypo-osmolality and hyponatremia: Secondary | ICD-10-CM | POA: Diagnosis not present

## 2013-07-04 DIAGNOSIS — G4733 Obstructive sleep apnea (adult) (pediatric): Secondary | ICD-10-CM

## 2013-07-04 DIAGNOSIS — Z01812 Encounter for preprocedural laboratory examination: Secondary | ICD-10-CM

## 2013-07-04 DIAGNOSIS — M179 Osteoarthritis of knee, unspecified: Secondary | ICD-10-CM | POA: Diagnosis present

## 2013-07-04 DIAGNOSIS — Z96652 Presence of left artificial knee joint: Secondary | ICD-10-CM

## 2013-07-04 DIAGNOSIS — G4730 Sleep apnea, unspecified: Secondary | ICD-10-CM | POA: Diagnosis present

## 2013-07-04 DIAGNOSIS — D62 Acute posthemorrhagic anemia: Secondary | ICD-10-CM

## 2013-07-04 DIAGNOSIS — M1710 Unilateral primary osteoarthritis, unspecified knee: Principal | ICD-10-CM | POA: Diagnosis present

## 2013-07-04 HISTORY — PX: TOTAL KNEE ARTHROPLASTY: SHX125

## 2013-07-04 LAB — TYPE AND SCREEN: ABO/RH(D): O POS

## 2013-07-04 LAB — ABO/RH: ABO/RH(D): O POS

## 2013-07-04 SURGERY — ARTHROPLASTY, KNEE, TOTAL
Anesthesia: Spinal | Site: Knee | Laterality: Left | Wound class: Clean

## 2013-07-04 MED ORDER — BUPIVACAINE LIPOSOME 1.3 % IJ SUSP
20.0000 mL | Freq: Once | INTRAMUSCULAR | Status: DC
  Filled 2013-07-04: qty 20

## 2013-07-04 MED ORDER — CEFAZOLIN SODIUM 1-5 GM-% IV SOLN
1.0000 g | Freq: Four times a day (QID) | INTRAVENOUS | Status: AC
  Administered 2013-07-04 (×2): 1 g via INTRAVENOUS
  Filled 2013-07-04 (×2): qty 50

## 2013-07-04 MED ORDER — METHOCARBAMOL 500 MG PO TABS
500.0000 mg | ORAL_TABLET | Freq: Four times a day (QID) | ORAL | Status: DC | PRN
  Administered 2013-07-05 (×2): 500 mg via ORAL
  Filled 2013-07-04 (×2): qty 1

## 2013-07-04 MED ORDER — METOCLOPRAMIDE HCL 10 MG PO TABS: 5.0000 mg | ORAL_TABLET | Freq: Three times a day (TID) | ORAL | Status: DC | PRN

## 2013-07-04 MED ORDER — ONDANSETRON HCL 4 MG PO TABS: 4.0000 mg | ORAL_TABLET | Freq: Four times a day (QID) | ORAL | Status: DC | PRN

## 2013-07-04 MED ORDER — METHOCARBAMOL 100 MG/ML IJ SOLN
500.0000 mg | Freq: Four times a day (QID) | INTRAMUSCULAR | Status: DC | PRN
  Administered 2013-07-04: 15:00:00 500 mg via INTRAVENOUS
  Filled 2013-07-04: qty 5

## 2013-07-04 MED ORDER — ZOLPIDEM TARTRATE 10 MG PO TABS: 5.0000 mg | ORAL_TABLET | Freq: Every evening | ORAL | Status: DC | PRN

## 2013-07-04 MED ORDER — CEFAZOLIN SODIUM-DEXTROSE 2-3 GM-% IV SOLR
2.0000 g | INTRAVENOUS | Status: AC
  Administered 2013-07-04: 2 g via INTRAVENOUS

## 2013-07-04 MED ORDER — CEFAZOLIN SODIUM-DEXTROSE 2-3 GM-% IV SOLR
INTRAVENOUS | Status: AC
  Filled 2013-07-04: qty 50

## 2013-07-04 MED ORDER — KETAMINE HCL 50 MG/ML IJ SOLN
INTRAMUSCULAR | Status: DC | PRN
  Administered 2013-07-04: 25 mg via INTRAMUSCULAR

## 2013-07-04 MED ORDER — BUPIVACAINE HCL 0.25 % IJ SOLN
INTRAMUSCULAR | Status: DC | PRN
  Administered 2013-07-04: 20 mL

## 2013-07-04 MED ORDER — SODIUM CHLORIDE 0.9 % IJ SOLN
INTRAMUSCULAR | Status: DC | PRN
  Administered 2013-07-04: 30 mL via INTRAVENOUS

## 2013-07-04 MED ORDER — POLYETHYLENE GLYCOL 3350 17 G PO PACK: 17.0000 g | PACK | Freq: Every day | ORAL | Status: DC | PRN

## 2013-07-04 MED ORDER — HYDROMORPHONE HCL PF 1 MG/ML IJ SOLN: 0.2500 mg | INTRAMUSCULAR | Status: DC | PRN

## 2013-07-04 MED ORDER — BUPIVACAINE HCL (PF) 0.75 % IJ SOLN
INTRAMUSCULAR | Status: DC | PRN
  Administered 2013-07-04: 1.8 mL

## 2013-07-04 MED ORDER — PROPOFOL INFUSION 10 MG/ML OPTIME
INTRAVENOUS | Status: DC | PRN
  Administered 2013-07-04: 75 ug/kg/min via INTRAVENOUS

## 2013-07-04 MED ORDER — LOSARTAN POTASSIUM 50 MG PO TABS
100.0000 mg | ORAL_TABLET | Freq: Every day | ORAL | Status: DC
  Administered 2013-07-05: 100 mg via ORAL
  Filled 2013-07-04 (×2): qty 2

## 2013-07-04 MED ORDER — METOCLOPRAMIDE HCL 5 MG/ML IJ SOLN: 5.0000 mg | Freq: Three times a day (TID) | INTRAMUSCULAR | Status: DC | PRN

## 2013-07-04 MED ORDER — FENTANYL CITRATE 0.05 MG/ML IJ SOLN
INTRAMUSCULAR | Status: DC | PRN
  Administered 2013-07-04 (×2): 50 ug via INTRAVENOUS

## 2013-07-04 MED ORDER — LACTATED RINGERS IV SOLN
INTRAVENOUS | Status: DC
  Administered 2013-07-04: 1000 mL via INTRAVENOUS
  Administered 2013-07-04 (×2): via INTRAVENOUS

## 2013-07-04 MED ORDER — TRANEXAMIC ACID 100 MG/ML IV SOLN
1000.0000 mg | INTRAVENOUS | Status: AC
  Administered 2013-07-04: 1000 mg via INTRAVENOUS
  Filled 2013-07-04: qty 10

## 2013-07-04 MED ORDER — ACETAMINOPHEN 500 MG PO TABS
1000.0000 mg | ORAL_TABLET | Freq: Once | ORAL | Status: AC
  Administered 2013-07-04: 1000 mg via ORAL
  Filled 2013-07-04: qty 2

## 2013-07-04 MED ORDER — PROMETHAZINE HCL 25 MG/ML IJ SOLN: 6.2500 mg | INTRAMUSCULAR | Status: DC | PRN

## 2013-07-04 MED ORDER — ONDANSETRON HCL 4 MG/2ML IJ SOLN
INTRAMUSCULAR | Status: DC | PRN
  Administered 2013-07-04: 4 mg via INTRAVENOUS

## 2013-07-04 MED ORDER — BUPIVACAINE LIPOSOME 1.3 % IJ SUSP
INTRAMUSCULAR | Status: DC | PRN
  Administered 2013-07-04: 20 mL

## 2013-07-04 MED ORDER — TRAMADOL HCL 50 MG PO TABS: 50.0000 mg | ORAL_TABLET | Freq: Four times a day (QID) | ORAL | Status: DC | PRN

## 2013-07-04 MED ORDER — KETOROLAC TROMETHAMINE 15 MG/ML IJ SOLN
7.5000 mg | Freq: Four times a day (QID) | INTRAMUSCULAR | Status: AC | PRN
  Administered 2013-07-04: 7.5 mg via INTRAVENOUS
  Filled 2013-07-04 (×2): qty 1

## 2013-07-04 MED ORDER — BISACODYL 10 MG RE SUPP: 10.0000 mg | Freq: Every day | RECTAL | Status: DC | PRN

## 2013-07-04 MED ORDER — FLEET ENEMA 7-19 GM/118ML RE ENEM: 1.0000 | ENEMA | Freq: Once | RECTAL | Status: AC | PRN

## 2013-07-04 MED ORDER — SODIUM CHLORIDE 0.9 % IJ SOLN
INTRAMUSCULAR | Status: AC
  Filled 2013-07-04: qty 50

## 2013-07-04 MED ORDER — 0.9 % SODIUM CHLORIDE (POUR BTL) OPTIME
TOPICAL | Status: DC | PRN
  Administered 2013-07-04: 1000 mL

## 2013-07-04 MED ORDER — DOCUSATE SODIUM 100 MG PO CAPS
100.0000 mg | ORAL_CAPSULE | Freq: Two times a day (BID) | ORAL | Status: DC
  Administered 2013-07-04 – 2013-07-05 (×2): 100 mg via ORAL

## 2013-07-04 MED ORDER — ACETAMINOPHEN 500 MG PO TABS
1000.0000 mg | ORAL_TABLET | Freq: Four times a day (QID) | ORAL | Status: AC
  Administered 2013-07-04: 1000 mg via ORAL
  Filled 2013-07-04: qty 2

## 2013-07-04 MED ORDER — DEXTROSE-NACL 5-0.9 % IV SOLN
INTRAVENOUS | Status: DC
  Administered 2013-07-04: 14:00:00 via INTRAVENOUS

## 2013-07-04 MED ORDER — BUPIVACAINE HCL (PF) 0.25 % IJ SOLN
INTRAMUSCULAR | Status: AC
  Filled 2013-07-04: qty 30

## 2013-07-04 MED ORDER — RIVAROXABAN 10 MG PO TABS
10.0000 mg | ORAL_TABLET | Freq: Every day | ORAL | Status: DC
  Administered 2013-07-05: 10 mg via ORAL
  Filled 2013-07-04 (×2): qty 1

## 2013-07-04 MED ORDER — DIPHENHYDRAMINE HCL 12.5 MG/5ML PO ELIX
12.5000 mg | ORAL_SOLUTION | ORAL | Status: DC | PRN
  Administered 2013-07-05 (×2): 25 mg via ORAL
  Filled 2013-07-04 (×2): qty 10

## 2013-07-04 MED ORDER — MENTHOL 3 MG MT LOZG: 1.0000 | LOZENGE | OROMUCOSAL | Status: DC | PRN

## 2013-07-04 MED ORDER — SODIUM CHLORIDE 0.9 % IV SOLN: INTRAVENOUS | Status: DC

## 2013-07-04 MED ORDER — PHENYLEPHRINE HCL 10 MG/ML IJ SOLN
INTRAMUSCULAR | Status: DC | PRN
  Administered 2013-07-04 (×2): 80 ug via INTRAVENOUS

## 2013-07-04 MED ORDER — OXYCODONE HCL 5 MG PO TABS
5.0000 mg | ORAL_TABLET | ORAL | Status: DC | PRN
  Administered 2013-07-04 – 2013-07-05 (×7): 10 mg via ORAL
  Filled 2013-07-04 (×7): qty 2

## 2013-07-04 MED ORDER — PHENOL 1.4 % MT LIQD: 1.0000 | OROMUCOSAL | Status: DC | PRN

## 2013-07-04 MED ORDER — MIDAZOLAM HCL 5 MG/5ML IJ SOLN
INTRAMUSCULAR | Status: DC | PRN
  Administered 2013-07-04: 2 mg via INTRAVENOUS

## 2013-07-04 MED ORDER — CHLORHEXIDINE GLUCONATE 4 % EX LIQD
60.0000 mL | Freq: Once | CUTANEOUS | Status: DC
  Filled 2013-07-04: qty 60

## 2013-07-04 MED ORDER — DEXAMETHASONE SODIUM PHOSPHATE 10 MG/ML IJ SOLN
10.0000 mg | Freq: Once | INTRAMUSCULAR | Status: AC
  Administered 2013-07-04: 10 mg via INTRAVENOUS

## 2013-07-04 MED ORDER — ONDANSETRON HCL 4 MG/2ML IJ SOLN: 4.0000 mg | Freq: Four times a day (QID) | INTRAMUSCULAR | Status: DC | PRN

## 2013-07-04 MED ORDER — MORPHINE SULFATE 2 MG/ML IJ SOLN
1.0000 mg | INTRAMUSCULAR | Status: DC | PRN
  Administered 2013-07-04 (×2): 2 mg via INTRAVENOUS
  Administered 2013-07-05: 04:00:00 1 mg via INTRAVENOUS
  Filled 2013-07-04 (×3): qty 1

## 2013-07-04 SURGICAL SUPPLY — 57 items
BAG SPEC THK2 15X12 ZIP CLS (MISCELLANEOUS) ×1
BAG ZIPLOCK 12X15 (MISCELLANEOUS) ×2 IMPLANT
BANDAGE ELASTIC 6 VELCRO ST LF (GAUZE/BANDAGES/DRESSINGS) ×2 IMPLANT
BANDAGE ESMARK 6X9 LF (GAUZE/BANDAGES/DRESSINGS) ×1 IMPLANT
BLADE SAG 18X100X1.27 (BLADE) ×2 IMPLANT
BLADE SAW SGTL 11.0X1.19X90.0M (BLADE) ×2 IMPLANT
BNDG CMPR 9X6 STRL LF SNTH (GAUZE/BANDAGES/DRESSINGS) ×1
BNDG ESMARK 6X9 LF (GAUZE/BANDAGES/DRESSINGS) ×2
BOWL SMART MIX CTS (DISPOSABLE) ×2 IMPLANT
CAPT RP KNEE ×1 IMPLANT
CEMENT HV SMART SET (Cement) ×3 IMPLANT
CLOTH BEACON ORANGE TIMEOUT ST (SAFETY) ×2 IMPLANT
CUFF TOURN SGL QUICK 34 (TOURNIQUET CUFF) ×2
CUFF TRNQT CYL 34X4X40X1 (TOURNIQUET CUFF) ×1 IMPLANT
DECANTER SPIKE VIAL GLASS SM (MISCELLANEOUS) ×2 IMPLANT
DRAPE EXTREMITY T 121X128X90 (DRAPE) ×2 IMPLANT
DRAPE POUCH INSTRU U-SHP 10X18 (DRAPES) ×2 IMPLANT
DRAPE U-SHAPE 47X51 STRL (DRAPES) ×2 IMPLANT
DRSG ADAPTIC 3X8 NADH LF (GAUZE/BANDAGES/DRESSINGS) ×2 IMPLANT
DRSG PAD ABDOMINAL 8X10 ST (GAUZE/BANDAGES/DRESSINGS) ×2 IMPLANT
DURAPREP 26ML APPLICATOR (WOUND CARE) ×2 IMPLANT
ELECT REM PT RETURN 9FT ADLT (ELECTROSURGICAL) ×2
ELECTRODE REM PT RTRN 9FT ADLT (ELECTROSURGICAL) ×1 IMPLANT
EVACUATOR 1/8 PVC DRAIN (DRAIN) ×2 IMPLANT
FACESHIELD LNG OPTICON STERILE (SAFETY) ×10 IMPLANT
GLOVE BIO SURGEON STRL SZ7.5 (GLOVE) IMPLANT
GLOVE BIO SURGEON STRL SZ8 (GLOVE) ×2 IMPLANT
GLOVE BIOGEL PI IND STRL 8 (GLOVE) ×2 IMPLANT
GLOVE BIOGEL PI INDICATOR 8 (GLOVE) ×2
GLOVE SURG SS PI 6.5 STRL IVOR (GLOVE) IMPLANT
GOWN STRL NON-REIN LRG LVL3 (GOWN DISPOSABLE) ×2 IMPLANT
GOWN STRL REIN XL XLG (GOWN DISPOSABLE) IMPLANT
HANDPIECE INTERPULSE COAX TIP (DISPOSABLE) ×2
IMMOBILIZER KNEE 20 (SOFTGOODS) ×2
IMMOBILIZER KNEE 20 THIGH 36 (SOFTGOODS) ×1 IMPLANT
KIT BASIN OR (CUSTOM PROCEDURE TRAY) ×2 IMPLANT
MANIFOLD NEPTUNE II (INSTRUMENTS) ×2 IMPLANT
NDL SAFETY ECLIPSE 18X1.5 (NEEDLE) ×2 IMPLANT
NEEDLE HYPO 18GX1.5 SHARP (NEEDLE) ×4
NS IRRIG 1000ML POUR BTL (IV SOLUTION) ×2 IMPLANT
PACK TOTAL JOINT (CUSTOM PROCEDURE TRAY) ×2 IMPLANT
PADDING CAST COTTON 6X4 STRL (CAST SUPPLIES) ×4 IMPLANT
POSITIONER SURGICAL ARM (MISCELLANEOUS) ×2 IMPLANT
SET HNDPC FAN SPRY TIP SCT (DISPOSABLE) ×1 IMPLANT
SPONGE GAUZE 4X4 12PLY (GAUZE/BANDAGES/DRESSINGS) ×2 IMPLANT
STRIP CLOSURE SKIN 1/2X4 (GAUZE/BANDAGES/DRESSINGS) ×3 IMPLANT
SUCTION FRAZIER 12FR DISP (SUCTIONS) ×2 IMPLANT
SUT MNCRL AB 4-0 PS2 18 (SUTURE) ×2 IMPLANT
SUT VIC AB 2-0 CT1 27 (SUTURE) ×6
SUT VIC AB 2-0 CT1 TAPERPNT 27 (SUTURE) ×3 IMPLANT
SUT VLOC 180 0 24IN GS25 (SUTURE) ×2 IMPLANT
SYR 20CC LL (SYRINGE) ×2 IMPLANT
SYR 50ML LL SCALE MARK (SYRINGE) ×2 IMPLANT
TOWEL OR 17X26 10 PK STRL BLUE (TOWEL DISPOSABLE) ×4 IMPLANT
TRAY FOLEY CATH 14FRSI W/METER (CATHETERS) ×2 IMPLANT
WATER STERILE IRR 1500ML POUR (IV SOLUTION) ×2 IMPLANT
WRAP KNEE MAXI GEL POST OP (GAUZE/BANDAGES/DRESSINGS) ×2 IMPLANT

## 2013-07-04 NOTE — Progress Notes (Signed)
Utilization review completed.  

## 2013-07-04 NOTE — Transfer of Care (Signed)
Immediate Anesthesia Transfer of Care Note  Patient: Melvin Hamilton  Procedure(s) Performed: Procedure(s): LEFT TOTAL KNEE ARTHROPLASTY (Left)  Patient Location: PACU  Anesthesia Type:Spinal  Level of Consciousness: sedated  Airway & Oxygen Therapy: Patient Spontanous Breathing and Patient connected to face mask oxygen  Post-op Assessment: Report given to PACU RN and Post -op Vital signs reviewed and stable  Post vital signs: Reviewed and stable  Complications: No apparent anesthesia complications

## 2013-07-04 NOTE — Anesthesia Postprocedure Evaluation (Signed)
  Anesthesia Post-op Note  Patient: Melvin Hamilton  Procedure(s) Performed: Procedure(s) (LRB): LEFT TOTAL KNEE ARTHROPLASTY (Left)  Patient Location: PACU  Anesthesia Type: Spinal  Level of Consciousness: awake and alert   Airway and Oxygen Therapy: Patient Spontanous Breathing  Post-op Pain: mild  Post-op Assessment: Post-op Vital signs reviewed, Patient's Cardiovascular Status Stable, Respiratory Function Stable, Patent Airway and No signs of Nausea or vomiting  Last Vitals:  Filed Vitals:   07/04/13 1245  BP: 106/61  Pulse: 51  Temp:   Resp: 16    Post-op Vital Signs: stable   Complications: No apparent anesthesia complications

## 2013-07-04 NOTE — H&P (View-Only) (Signed)
Melvin Hamilton  DOB: 1946-07-11 Married / Language: English / Race: White Male  Date of Admission:  07/04/2013  Chief Complaint:  Left Knee Pain  History of Present Illness The patient is a 67 year old male who comes in for a preoperative History and Physical. The patient is scheduled for a left total knee arthroplasty to be performed by Dr. Gus Rankin. Aluisio, MD at [facility] on 07/04/2013. The patient is a 67 year old male who presents today for follow up of their knee. The patient is being followed for their left knee pain and osteoarthritis. They are now several week(s) out from Synvisc series. Symptoms reported today include: pain, swelling and aching. The patient feels that they are doing poorly and report their pain level to be severe. The following medication has been used for pain control: Hydrocodone (at night). The patient has not gotten any relief of their symptoms with viscosupplementation. He continues with significant pain and dysfunction in the left knee. He had area of bone on bone in his medial compartment where we did the scope. He unfortunately did not have response to cortisone or to visco supplement injections. He is ready to proceed with surgery. They have been treated conservatively in the past for the above stated problem and despite conservative measures, they continue to have progressive pain and severe functional limitations and dysfunction. They have failed non-operative management including home exercise, medications, and injections. It is felt that they would benefit from undergoing total joint replacement. Risks and benefits of the procedure have been discussed with the patient and they elect to proceed with surgery. There are no active contraindications to surgery such as ongoing infection or rapidly progressive neurological disease.  Problem List Primary osteoarthritis of one knee (715.16)   Allergies No Known Drug Allergies.  10/15/2012   Family History Congestive Heart Failure. father and brother Heart Disease. father and brother Cancer. brother   Social History Living situation. live with spouse Marital status. married Illicit drug use. no Drug/Alcohol Rehab (Previously). no Exercise. Exercises never Pain Contract. no Tobacco / smoke exposure. no Tobacco use. 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 2 Alcohol use. current drinker; drinks hard liquor; less than 5 per week Post-Surgical Plans. Plan is to go home.   Medication History Losartan Potassium (100MG  Tablet, Oral) Active. Norco (5-325MG  Tablet, 1 Oral at bedtime, as needed) Active. Naproxen (500MG  Tablet, Oral) Active. Zolpidem Tartrate (10MG  Tablet, Oral) Active.   Past Surgical History Spinal Surgery Tonsillectomy   Medical History Sleep Apnea. uses CPAP Cataract   Review of Systems General:Not Present- Chills, Fever, Night Sweats, Fatigue, Weight Gain, Weight Loss and Memory Loss. Skin:Not Present- Hives, Itching, Rash, Eczema and Lesions. HEENT:Not Present- Tinnitus, Headache, Double Vision, Visual Loss, Hearing Loss and Dentures. Respiratory:Not Present- Shortness of breath with exertion, Shortness of breath at rest, Allergies, Coughing up blood and Chronic Cough. Cardiovascular:Not Present- Chest Pain, Racing/skipping heartbeats, Difficulty Breathing Lying Down, Murmur, Swelling and Palpitations. Gastrointestinal:Not Present- Bloody Stool, Heartburn, Abdominal Pain, Vomiting, Nausea, Constipation, Diarrhea, Difficulty Swallowing, Jaundice and Loss of appetitie. Male Genitourinary:Not Present- Urinary frequency, Blood in Urine, Weak urinary stream, Discharge, Flank Pain, Incontinence, Painful Urination, Urgency, Urinary Retention and Urinating at Night. Musculoskeletal:Present- Joint Swelling and Joint Pain. Not Present- Muscle Weakness, Muscle  Pain, Back Pain, Morning Stiffness and Spasms. Neurological:Not Present- Tremor, Dizziness, Blackout spells, Paralysis, Difficulty with balance and Weakness. Psychiatric:Not Present- Insomnia.   Vitals Weight: 220 lb Weight was reported by  patient. Pulse: 64 (Regular) Resp.: 14 (Unlabored) BP: 124/76 (Sitting, Right Arm, Standard)    Physical Exam The physical exam findings are as follows:   General Mental Status - Alert, cooperative and good historian. General Appearance- pleasant. Not in acute distress. Orientation- Oriented X3. Build & Nutrition- Well nourished and Well developed.   Head and Neck Head- normocephalic, atraumatic . Neck Global Assessment- supple. no bruit auscultated on the right and no bruit auscultated on the left.   Eye Pupil- Bilateral- Regular and Round. Motion- Bilateral- EOMI.   Chest and Lung Exam Auscultation: Breath sounds:- clear at anterior chest wall and - clear at posterior chest wall. Adventitious sounds:- No Adventitious sounds.   Cardiovascular Auscultation:Rhythm- Regular rate and rhythm. Heart Sounds- S1 WNL and S2 WNL. Murmurs & Other Heart Sounds:Auscultation of the heart reveals - No Murmurs.   Abdomen Palpation/Percussion:Tenderness- Abdomen is non-tender to palpation. Rigidity (guarding)- Abdomen is soft. Auscultation:Auscultation of the abdomen reveals - Bowel sounds normal.   Male Genitourinary Not done, not pertinent to present illness  Musculoskeletal On exam he is alert and oriented in no apparent distress. Evaluation of his left knee shows slight effusion. His range is about 5 to 125. He is very tender along the medial aspect of the knee. There is no lateral tenderness. He still has some laxity with valgus stressing.  RADIOGRAPHS: R-rays taken today AP both knees and lateral. He is now bone on bone in the medial compartment with significant patellofemoral narrowing  also.  Assessment & Plan Primary osteoarthritis of one knee (715.16) Impression: Left Knee  Note: Plan is for a Left Total Knee Replacement by Dr. Lequita Halt.  Plan is to go home.  PCP - Dr. Roseanne Reno  The patient does not have any contraindications and will recieve TXA (tranexamic acid) prior to surgery.  Time spent ~ 20 mins  Signed electronically by Lauraine Rinne, III PA-C

## 2013-07-04 NOTE — Evaluation (Signed)
Physical Therapy Evaluation Patient Details Name: Melvin Hamilton MRN: [id number] DOB: June 11, 1946 Today's Date: 07/04/2013 Time: 1744-1800 PT Time Calculation (min): 16 min  PT Assessment / Plan / Recommendation History of Present Illness  s/p L TKA 9/8  Clinical Impression  On eval, POD 0, pt required Min assist for mobility-able to ambulate ~45 feet with RW. Anticipate pt will progress well during stay. Recommend HHPT, RW.     PT Assessment  Patient needs continued PT services    Follow Up Recommendations  Home health PT    Does the patient have the potential to tolerate intense rehabilitation      Barriers to Discharge        Equipment Recommendations  Rolling walker with 5" wheels    Recommendations for Other Services OT consult   Frequency 7X/week    Precautions / Restrictions Precautions Precautions: Fall;Knee Required Braces or Orthoses: Knee Immobilizer - Left Knee Immobilizer - Left: Discontinue once straight leg raise with < 10 degree lag Restrictions Weight Bearing Restrictions: No LLE Weight Bearing: Weight bearing as tolerated   Pertinent Vitals/Pain 5/10 L knee      Mobility  Bed Mobility Bed Mobility: Supine to Sit;Sit to Supine Supine to Sit: 4: Min assist Details for Bed Mobility Assistance: Assist for L LE Transfers Transfers: Sit to Stand;Stand to Sit Sit to Stand: 4: Min assist;From bed Stand to Sit: 4: Min assist;To bed Details for Transfer Assistance: VCs safety, tehnique, hand placement. Assist to rise, stabilize, control descent Ambulation/Gait Ambulation/Gait Assistance: 4: Min assist Ambulation Distance (Feet): 45 Feet Assistive device: Rolling walker Ambulation/Gait Assistance Details: VCs safety, technqiue, sequence. assist to stabilize intermittently Gait Pattern: Step-to pattern;Step-through pattern;Decreased stride length    Exercises     PT Diagnosis: Difficulty walking;Acute pain;Abnormality of gait  PT Problem List: Decreased  strength;Decreased range of motion;Decreased mobility;Decreased knowledge of use of DME;Pain;Decreased activity tolerance PT Treatment Interventions: DME instruction;Gait training;Stair training;Functional mobility training;Therapeutic activities;Therapeutic exercise;Patient/family education     PT Goals(Current goals can be found in the care plan section) Acute Rehab PT Goals Patient Stated Goal: home. regain independence PT Goal Formulation: With patient/family Time For Goal Achievement: 07/11/13 Potential to Achieve Goals: Good  Visit Information  Last PT Received On: 07/04/13 Assistance Needed: +1 History of Present Illness: s/p L TKA 9/8       Prior Functioning  Home Living Family/patient expects to be discharged to:: Private residence Living Arrangements: Spouse/significant other Available Help at Discharge: Family Type of Home: House Home Access: Stairs to enter Secretary/administrator of Steps: 3 Entrance Stairs-Rails: Right Home Layout: One level Home Equipment: Environmental consultant - standard Prior Function Level of Independence: Independent Communication Communication: No difficulties    Cognition  Cognition Arousal/Alertness: Awake/alert Behavior During Therapy: WFL for tasks assessed/performed Overall Cognitive Status: Within Functional Limits for tasks assessed    Extremity/Trunk Assessment Upper Extremity Assessment Upper Extremity Assessment: Defer to OT evaluation Lower Extremity Assessment Lower Extremity Assessment: LLE deficits/detail LLE Deficits / Details: hip flex 3/5, moves ankle well Cervical / Trunk Assessment Cervical / Trunk Assessment: Normal   Balance    End of Session PT - End of Session Activity Tolerance: Patient tolerated treatment well Patient left: in bed;with call bell/phone within reach;with family/visitor present CPM Left Knee CPM Left Knee: Off  GP     Rebeca Alert, MPT Pager: [phone]

## 2013-07-04 NOTE — Anesthesia Preprocedure Evaluation (Addendum)
Anesthesia Evaluation  Patient identified by MRN, date of birth, ID band Patient awake    Reviewed: Allergy & Precautions, H&P , NPO status , Patient's Chart, lab work & pertinent test results  Airway Mallampati: II TM Distance: <3 FB Neck ROM: Full    Dental no notable dental hx.    Pulmonary sleep apnea ,  breath sounds clear to auscultation  Pulmonary exam normal       Cardiovascular hypertension, Pt. on medications Rhythm:Regular Rate:Normal  Left ventricle: Systolic function was normal. The   estimated ejection fraction was in the range of 55% to   60%. Wall motion was normal; there were no regional wall   motion abnormalities. Features are consistent with a   pseudonormal left ventricular filling pattern, with   concomitant abnormal relaxation and increased filling   pressure (grade 2 diastolic dysfunction). - Aortic valve: Trivial regurgitation. - Mitral valve: Trivial regurgitation. - Left atrium: The atrium was mildly dilated. - Tricuspid valve: Trivial regurgitation. - Pulmonary arteries: PA peak pressure: 41mm Hg (S). - Pericardium, extracardiac: There was no pericardial   effusion.    Neuro/Psych negative neurological ROS  negative psych ROS   GI/Hepatic negative GI ROS, Neg liver ROS,   Endo/Other  Morbid obesity  Renal/GU negative Renal ROS  negative genitourinary   Musculoskeletal negative musculoskeletal ROS (+)   Abdominal   Peds negative pediatric ROS (+)  Hematology negative hematology ROS (+) Blood dyscrasia, , Blood dyscrasia 1993 hemochromatosis--pt was giving blood about evry 50 to 70 days ( [facility] )  - last time was February 16 2013              Anesthesia Other Findings   Reproductive/Obstetrics negative OB ROS                          Anesthesia Physical Anesthesia Plan  ASA: III  Anesthesia Plan: Spinal   Post-op Pain Management:     Induction: Intravenous  Airway Management Planned: Simple Face Mask  Additional Equipment:   Intra-op Plan:   Post-operative Plan:   Informed Consent: I have reviewed the patients History and Physical, chart, labs and discussed the procedure including the risks, benefits and alternatives for the proposed anesthesia with the patient or authorized representative who has indicated his/her understanding and acceptance.     Plan Discussed with: CRNA and Surgeon  Anesthesia Plan Comments:         Anesthesia Quick Evaluation

## 2013-07-04 NOTE — Op Note (Signed)
Pre-operative diagnosis- Osteoarthritis  Left knee(s)  Post-operative diagnosis- Osteoarthritis Left knee(s)  Procedure-  Left  Total Knee Arthroplasty  Surgeon- Gus Rankin. Michelangelo Rindfleisch, MD  Assistant- Avel Peace, PA-C   Anesthesia-  Spinal EBL-* No blood loss amount entered *  Drains Hemovac  Tourniquet time-  35 minutes @ 300 mm Hg  Complications- None  Condition-PACU - hemodynamically stable.   Brief Clinical Note   Melvin Hamilton is a 67 y.o. year old male with end stage OA of his left knee with progressively worsening pain and dysfunction. He has constant pain, with activity and at rest and significant functional deficits with difficulties even with ADLs. He has had extensive non-op management including analgesics, injections of cortisone and viscosupplements, and home exercise program, but remains in significant pain with significant dysfunction. Radiographs show bone on bone arthritis medial and patellofemoral. He presents now for left Total Knee Arthroplasty.     Procedure in detail---   The patient is brought into the operating room and positioned supine on the operating table. After successful administration of  Spinal,   a tourniquet is placed high on the  Left thigh(s) and the lower extremity is prepped and draped in the usual sterile fashion. Time out is performed by the operating team and then the  Left lower extremity is wrapped in Esmarch, knee flexed and the tourniquet inflated to 300 mmHg.       A midline incision is made with a ten blade through the subcutaneous tissue to the level of the extensor mechanism. A fresh blade is used to make a medial parapatellar arthrotomy. Soft tissue over the proximal medial tibia is subperiosteally elevated to the joint line with a knife and into the semimembranosus bursa with a Cobb elevator. Soft tissue over the proximal lateral tibia is elevated with attention being paid to avoiding the patellar tendon on the tibial tubercle. The patella is  everted, knee flexed 90 degrees and the ACL and PCL are removed. Findings are bone on bone medial and patellofemoral.        The drill is used to create a starting hole in the distal femur and the canal is thoroughly irrigated with sterile saline to remove the fatty contents. The 5 degree Left  valgus alignment guide is placed into the femoral canal and the distal femoral cutting block is pinned to remove 10 mm off the distal femur. Resection is made with an oscillating saw.      The tibia is subluxed forward and the menisci are removed. The extramedullary alignment guide is placed referencing proximally at the medial aspect of the tibial tubercle and distally along the second metatarsal axis and tibial crest. The block is pinned to remove 2mm off the more deficient medial  side. Resection is made with an oscillating saw. Size 4 is the most appropriate size for the tibia and the proximal tibia is prepared with the modular drill and keel punch for that size.      The femoral sizing guide is placed and size 4 narrow is most appropriate. Rotation is marked off the epicondylar axis and confirmed by creating a rectangular flexion gap at 90 degrees. The size 4 cutting block is pinned in this rotation and the anterior, posterior and chamfer cuts are made with the oscillating saw. The intercondylar block is then placed and that cut is made.      Trial size 4 tibial component, trial size 4 narrow posterior stabilized femur and a 10  mm posterior stabilized  rotating platform insert trial is placed. Full extension is achieved with excellent varus/valgus and anterior/posterior balance throughout full range of motion. The patella is everted and thickness measured to be 22  mm. Free hand resection is taken to 12 mm, a 38 template is placed, lug holes are drilled, trial patella is placed, and it tracks normally. Osteophytes are removed off the posterior femur with the trial in place. All trials are removed and the cut bone  surfaces prepared with pulsatile lavage. Cement is mixed and once ready for implantation, the size 4 tibial implant, size  4 narrow posterior stabilized femoral component, and the size 38 patella are cemented in place and the patella is held with the clamp. The trial insert is placed and the knee held in full extension. The Exparel (20 ml mixed with 30 ml saline) and .25% Bupivicaine, are injected into the extensor mechanism, posterior capsule, medial and lateral gutters and subcutaneous tissues.  All extruded cement is removed and once the cement is hard the permanent 10 mm posterior stabilized rotating platform insert is placed into the tibial tray.      The wound is copiously irrigated with saline solution and the extensor mechanism closed over a hemovac drain with #1 PDS suture. The tourniquet is released for a total tourniquet time of 35  minutes. Flexion against gravity is 140 degrees and the patella tracks normally. Subcutaneous tissue is closed with 2.0 vicryl and subcuticular with running 4.0 Monocryl. The incision is cleaned and dried and steri-strips and a bulky sterile dressing are applied. The limb is placed into a knee immobilizer and the patient is awakened and transported to recovery in stable condition.      Please note that a surgical assistant was a medical necessity for this procedure in order to perform it in a safe and expeditious manner. Surgical assistant was necessary to retract the ligaments and vital neurovascular structures to prevent injury to them and also necessary for proper positioning of the limb to allow for anatomic placement of the prosthesis.   Gus Rankin Melvin Munro, MD    07/04/2013, 11:41 AM

## 2013-07-04 NOTE — Interval H&P Note (Signed)
History and Physical Interval Note:  07/04/2013 9:44 AM  Melvin Hamilton  has presented today for surgery, with the diagnosis of Osteoarthritis of the Left Knee  The various methods of treatment have been discussed with the patient and family. After consideration of risks, benefits and other options for treatment, the patient has consented to  Procedure(s): LEFT TOTAL KNEE ARTHROPLASTY (Left) as a surgical intervention .  The patient's history has been reviewed, patient examined, no change in status, stable for surgery.  I have reviewed the patient's chart and labs.  Questions were answered to the patient's satisfaction.     Loanne Drilling

## 2013-07-04 NOTE — Anesthesia Procedure Notes (Signed)
Spinal  Patient location during procedure: OR Start time: 07/04/2013 10:36 AM End time: 07/04/2013 10:43 AM Staffing CRNA/Resident: Paris Lore Performed by: resident/CRNA  Preanesthetic Checklist Completed: patient identified, site marked, surgical consent, pre-op evaluation, timeout performed, IV checked, risks and benefits discussed and monitors and equipment checked Spinal Block Patient position: sitting Prep: Betadine Patient monitoring: heart rate, continuous pulse ox and blood pressure Approach: midline Location: L2-3 Injection technique: single-shot Needle Needle type: Spinocan  Needle gauge: 22 G Needle length: 9 cm Needle insertion depth: 6 cm Assessment Sensory level: T4 Additional Notes Expiration date of kit checked and confirmed. Patient tolerated procedure well, without complications.  Sitting position X 2 attempts noted clear CSF return easy aspiration and administration of medication.

## 2013-07-05 ENCOUNTER — Encounter (HOSPITAL_COMMUNITY): Payer: Self-pay | Admitting: Orthopedic Surgery

## 2013-07-05 DIAGNOSIS — D62 Acute posthemorrhagic anemia: Secondary | ICD-10-CM

## 2013-07-05 DIAGNOSIS — E871 Hypo-osmolality and hyponatremia: Secondary | ICD-10-CM | POA: Diagnosis not present

## 2013-07-05 LAB — BASIC METABOLIC PANEL
BUN: 12 mg/dL (ref 6–23)
Creatinine, Ser: 0.83 mg/dL (ref 0.50–1.35)
GFR calc Af Amer: >90 mL/min (ref >90)
GFR calc non Af Amer: 88 mL/min — ABNORMAL LOW (ref >90)
Glucose, Bld: 145 mg/dL — ABNORMAL HIGH (ref 70–99)
Potassium: 4.2 mEq/L (ref 3.5–5.1)

## 2013-07-05 LAB — CBC
HCT: 34.4 % — ABNORMAL LOW (ref 39.0–52.0)
Hemoglobin: 11.3 g/dL — ABNORMAL LOW (ref 13.0–17.0)
MCHC: 32.8 g/dL (ref 30.0–36.0)
MCV: 88.2 fL (ref 78.0–100.0)
RDW: 14.9 % (ref 11.5–15.5)

## 2013-07-05 MED ORDER — RIVAROXABAN 10 MG PO TABS: 10.0000 mg | ORAL_TABLET | Freq: Every day | ORAL | Status: DC

## 2013-07-05 MED ORDER — OXYCODONE HCL 5 MG PO TABS: 5.0000 mg | ORAL_TABLET | ORAL | Status: DC | PRN

## 2013-07-05 MED ORDER — METHOCARBAMOL 500 MG PO TABS: 500.0000 mg | ORAL_TABLET | Freq: Four times a day (QID) | ORAL | Status: DC | PRN

## 2013-07-05 NOTE — Care Management Note (Signed)
    Page 1 of 2   07/05/2013     4:32:46 PM   CARE MANAGEMENT NOTE 07/05/2013  Patient:  Melvin Hamilton, Melvin Hamilton   Account Number:  [id number]  Date Initiated:  07/05/2013  Documentation initiated by:  Colleen Can  Subjective/Objective Assessment:   dx Left total knee replacemnt     Action/Plan:   CM spoke with patient. Plans are for him to return to his home in [place] where spouse will provide care. He use [place] for [facility] services. RW is needed.   Anticipated DC Date:  07/05/2013   Anticipated DC Plan:  HOME W HOME HEALTH SERVICES      DC Planning Services  CM consult      [facility] Choice  HOME HEALTH  DURABLE MEDICAL EQUIPMENT   Choice offered to / List presented to:  C-1 Patient   DME arranged  [facility] Hurst      DME agency  [facility]     HH arranged  HH-2 PT      [facility] agency  [facility]   Status of service:  Completed, signed off Medicare Important Message given?   (If response is "NO", the following Medicare IM given date fields will be blank) Date Medicare IM given:   Date Additional Medicare IM given:    Discharge Disposition:  HOME W HOME HEALTH SERVICES  Per UR Regulation:    If discussed at Long Length of Stay Meetings, dates discussed:    Comments:

## 2013-07-05 NOTE — Discharge Summary (Signed)
Physician Discharge Summary   Patient ID: Melvin Hamilton MRN: [id number] DOB/AGE: August 28, 1946 67 y.o.  Admit date: 07/04/2013 Discharge date: 07/05/2013  Primary Diagnosis:Osteoarthritis Left knee(s)    Admission Diagnoses:  Past Medical History  Diagnosis Date  . Hypertension   . H/O sleep apnea   . Dermatitis   . Acne   . Sleep apnea   . Arthritis     oa left knee  . 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   Weight as of this encounter: 102.059 kg (225 lb).  Procedure:  Procedure(s) (LRB): LEFT TOTAL KNEE ARTHROPLASTY (Left)   Consults: None  HPI: Melvin Hamilton is a 67 y.o. year old male with end stage OA of his left knee with progressively worsening pain and dysfunction. He has constant pain, with activity and at rest and significant functional deficits with difficulties even with ADLs. He has had extensive non-op management including analgesics, injections of cortisone and viscosupplements, and home exercise program, but remains in significant pain with significant dysfunction. Radiographs show bone on bone arthritis medial and patellofemoral. He presents now for left Total Knee Arthroplasty.   Laboratory Data: Admission on 07/04/2013, Discharged on 07/05/2013  Component Date Value Range Status  . ABO/RH(D) 07/04/2013 O POS   Final  . Antibody Screen 07/04/2013 NEG   Final  . Sample Expiration 07/04/2013 07/07/2013   Final  . ABO/RH(D) 07/04/2013 O POS   Final  . WBC 07/05/2013 15.0* 4.0 - 10.5 K/uL Final  . RBC 07/05/2013 3.90* 4.22 - 5.81 MIL/uL Final  . Hemoglobin 07/05/2013 11.3* 13.0 -  17.0 g/dL Final  . HCT 40/98/1191 34.4* 39.0 - 52.0 % Final  . MCV 07/05/2013 88.2  78.0 - 100.0 fL Final  . MCH 07/05/2013 29.0  26.0 - 34.0 pg Final  . MCHC 07/05/2013 32.8  30.0 - 36.0 g/dL Final  . RDW 47/82/9562 14.9  11.5 - 15.5 % Final  . Platelets 07/05/2013 221  150 - 400 K/uL Final  . Sodium 07/05/2013 133* 135 - 145 mEq/L Final  . Potassium 07/05/2013 4.2  3.5 - 5.1 mEq/L Final  . Chloride 07/05/2013 100  96 - 112 mEq/L Final  . CO2 07/05/2013 26  19 - 32 mEq/L Final  . Glucose, Bld 07/05/2013 145* 70 - 99 mg/dL Final  . BUN 13/05/6577 12  6 - 23 mg/dL Final  . Creatinine, Ser 07/05/2013 0.83  0.50 - 1.35 mg/dL Final  . Calcium 46/96/2952 8.7  8.4 - 10.5 mg/dL Final  . GFR calc non Af Amer 07/05/2013 88* >90 mL/min Final  . GFR calc Af Amer 07/05/2013 >90  >90 mL/min Final   Comment: (NOTE)                          The eGFR has been calculated using the CKD EPI equation.                          This calculation has not been validated in all  clinical situations.                          eGFR's persistently <90 mL/min signify possible Chronic Kidney                          Disease.  Hospital Outpatient Visit on 06/22/2013  Component Date Value Range Status  . MRSA, PCR 06/22/2013 NEGATIVE  NEGATIVE Final  . Staphylococcus aureus 06/22/2013 NEGATIVE  NEGATIVE Final   Comment:                                 The Xpert SA Assay (FDA                          approved for NASAL specimens                          in patients over 32 years of age),                          is one component of                          a comprehensive surveillance                          program.  Test performance has                          been validated by [employer] for patients greater                          than or equal to 11 year old.                          It is not intended                          to diagnose infection nor to                           guide or monitor treatment.  Marland Kitchen aPTT 06/22/2013 27  24 - 37 seconds Final  . WBC 06/22/2013 8.3  4.0 - 10.5 K/uL Final  . RBC 06/22/2013 4.79  4.22 - 5.81 MIL/uL Final  . Hemoglobin 06/22/2013 13.8  13.0 - 17.0 g/dL Final  . HCT 16/07/9603 42.1  39.0 - 52.0 % Final  . MCV 06/22/2013 87.9  78.0 - 100.0 fL Final  . MCH 06/22/2013 28.8  26.0 - 34.0 pg Final  . MCHC 06/22/2013 32.8  30.0 - 36.0 g/dL Final  . RDW 54/06/8118 15.4  11.5 - 15.5 % Final  . Platelets 06/22/2013 243  150 - 400 K/uL Final  . Sodium 06/22/2013 138  135 - 145 mEq/L Final  . Potassium 06/22/2013 4.3  3.5 - 5.1 mEq/L Final  . Chloride 06/22/2013 102  96 - 112 mEq/L  Final  . CO2 06/22/2013 28  19 - 32 mEq/L Final  . Glucose, Bld 06/22/2013 101* 70 - 99 mg/dL Final  . BUN 40/98/1191 16  6 - 23 mg/dL Final  . Creatinine, Ser 06/22/2013 0.86  0.50 - 1.35 mg/dL Final  . Calcium 47/82/9562 9.2  8.4 - 10.5 mg/dL Final  . Total Protein 06/22/2013 7.1  6.0 - 8.3 g/dL Final  . Albumin 13/05/6577 3.9  3.5 - 5.2 g/dL Final  . AST 46/96/2952 18  0 - 37 U/L Final  . ALT 06/22/2013 18  0 - 53 U/L Final  . Alkaline Phosphatase 06/22/2013 89  39 - 117 U/L Final  . Total Bilirubin 06/22/2013 0.5  0.3 - 1.2 mg/dL Final  . GFR calc non Af Amer 06/22/2013 86* >90 mL/min Final  . GFR calc Af Amer 06/22/2013 >90  >90 mL/min Final   Comment: (NOTE)                          The eGFR has been calculated using the CKD EPI equation.                          This calculation has not been validated in all clinical situations.                          eGFR's persistently <90 mL/min signify possible Chronic Kidney                          Disease.  Marland Kitchen Prothrombin Time 06/22/2013 13.2  11.6 - 15.2 seconds Final  . INR 06/22/2013 1.02  0.00 - 1.49 Final  . Color, Urine 06/22/2013 YELLOW  YELLOW Final  . APPearance 06/22/2013 CLEAR  CLEAR Final  . Specific Gravity, Urine 06/22/2013 1.012  1.005 - 1.030 Final  . pH 06/22/2013 6.5  5.0 - 8.0 Final    . Glucose, UA 06/22/2013 NEGATIVE  NEGATIVE mg/dL Final  . Hgb urine dipstick 06/22/2013 NEGATIVE  NEGATIVE Final  . Bilirubin Urine 06/22/2013 NEGATIVE  NEGATIVE Final  . Ketones, ur 06/22/2013 NEGATIVE  NEGATIVE mg/dL Final  . Protein, ur 84/13/2440 NEGATIVE  NEGATIVE mg/dL Final  . Urobilinogen, UA 06/22/2013 0.2  0.0 - 1.0 mg/dL Final  . Nitrite 08/23/2535 NEGATIVE  NEGATIVE Final  . Leukocytes, UA 06/22/2013 NEGATIVE  NEGATIVE Final   MICROSCOPIC NOT DONE ON URINES WITH NEGATIVE PROTEIN, BLOOD, LEUKOCYTES, NITRITE, OR GLUCOSE <1000 mg/dL.     X-Rays:No results found.  EKG: Orders placed during the hospital encounter of 08/28/12  . EKG 12-LEAD  . EKG 12-LEAD  . EKG 12-LEAD  . EKG 12-LEAD  . EKG     Hospital Course: Melvin Hamilton is a 67 y.o. who was admitted to [facility]. They were brought to the operating room on 07/04/2013 and underwent Procedure(s): LEFT TOTAL KNEE ARTHROPLASTY.  Patient tolerated the procedure well and was later transferred to the recovery room and then to the orthopaedic floor for postoperative care.  They were given PO and IV analgesics for pain control following their surgery.  They were given 24 hours of postoperative antibiotics of  Anti-infectives   Start     Dose/Rate Route Frequency Ordered Stop   07/04/13 1700  ceFAZolin (ANCEF) IVPB 1 g/50 mL premix     1 g 100 mL/hr over 30 Minutes Intravenous Every 6  hours 07/04/13 1335 07/05/13 0003   07/04/13 0800  ceFAZolin (ANCEF) IVPB 2 g/50 mL premix     2 g 100 mL/hr over 30 Minutes Intravenous On call to O.R. 07/04/13 0751 07/04/13 1045     and started on DVT prophylaxis in the form of Xarelto.   PT and OT were ordered for total joint protocol.  Discharge planning consulted to help with postop disposition and equipment needs.  Patient had a good night on the evening of surgery.  They started to get up OOB with therapy on day one and walked 150 feet the first time and then 165 feet the second  time. Hemovac drain was pulled without difficulty.  Patient was seen in rounds and wanted to go home later that same day.   Discharge Medications: Prior to Admission medications   Medication Sig Start Date End Date Taking? Authorizing Provider  losartan (COZAAR) 100 MG tablet Take 100 mg by mouth daily before breakfast.    Yes Historical Provider, MD  zolpidem (AMBIEN) 10 MG tablet Take 10 mg by mouth at bedtime as needed for sleep.   Yes Historical Provider, MD  methocarbamol (ROBAXIN) 500 MG tablet Take 1 tablet (500 mg total) by mouth every 6 (six) hours as needed. 07/05/13   Alexzandrew Perkins, PA-C  oxyCODONE (OXY IR/ROXICODONE) 5 MG immediate release tablet Take 1-2 tablets (5-10 mg total) by mouth every 3 (three) hours as needed. 07/05/13   Alexzandrew Julien Girt, PA-C  rivaroxaban (XARELTO) 10 MG TABS tablet Take 1 tablet (10 mg total) by mouth daily with breakfast. Take Xarelto for two and a half more weeks, then discontinue Xarelto. Once the patient has completed the blood thinner regimen, then take a Baby 81 mg Aspirin daily for four more weeks. 07/05/13   Alexzandrew Julien Girt, PA-C    Diet: Regular diet Activity:WBAT Follow-up:in 2 weeks Disposition - Home Discharged Condition: good       Discharge Orders   Future Orders Complete By Expires   Call MD / Call 911  As directed    Comments:     If you experience chest pain or shortness of breath, CALL 911 and be transported to the hospital emergency room.  If you develope a fever above 101 F, pus (white drainage) or increased drainage or redness at the wound, or calf pain, call your surgeon's office.   Change dressing  As directed    Comments:     Change dressing daily with sterile 4 x 4 inch gauze dressing and apply TED hose. Do not submerge the incision under water.   Constipation Prevention  As directed    Comments:     Drink plenty of fluids.  Prune juice may be helpful.  You may use a stool softener, such as Colace (over the  counter) 100 mg twice a day.  Use MiraLax (over the counter) for constipation as needed.   Diet - low sodium heart healthy  As directed    Discharge instructions  As directed    Comments:     Pick up stool softner and laxative for home. Do not submerge incision under water. May shower. Continue to use ice for pain and swelling from surgery. Hip precautions.  Total Hip Protocol.  Take Xarelto for two and a half more weeks, then discontinue Xarelto.   Do not put a pillow under the knee. Place it under the heel.  As directed    Do not sit on low chairs, stoools or toilet seats, as it may  be difficult to get up from low surfaces  As directed    Driving restrictions  As directed    Comments:     No driving until released by the physician.   Increase activity slowly as tolerated  As directed    Lifting restrictions  As directed    Comments:     No lifting until released by the physician.   Patient may shower  As directed    Comments:     You may shower without a dressing once there is no drainage.  Do not wash over the wound.  If drainage remains, do not shower until drainage stops.   TED hose  As directed    Comments:     Use stockings (TED hose) for 3 weeks on both leg(s).  You may remove them at night for sleeping.   Weight bearing as tolerated  As directed        Medication List    STOP taking these medications       HYDROcodone-acetaminophen 5-325 MG per tablet  Commonly known as:  NORCO/VICODIN      TAKE these medications       losartan 100 MG tablet  Commonly known as:  COZAAR  Take 100 mg by mouth daily before breakfast.     methocarbamol 500 MG tablet  Commonly known as:  ROBAXIN  Take 1 tablet (500 mg total) by mouth every 6 (six) hours as needed.     oxyCODONE 5 MG immediate release tablet  Commonly known as:  Oxy IR/ROXICODONE  Take 1-2 tablets (5-10 mg total) by mouth every 3 (three) hours as needed.     rivaroxaban 10 MG Tabs tablet  Commonly known as:   XARELTO  - Take 1 tablet (10 mg total) by mouth daily with breakfast. Take Xarelto for two and a half more weeks, then discontinue Xarelto.  - Once the patient has completed the blood thinner regimen, then take a Baby 81 mg Aspirin daily for four more weeks.     zolpidem 10 MG tablet  Commonly known as:  AMBIEN  Take 10 mg by mouth at bedtime as needed for sleep.       Follow-up Information   Follow up with Loanne Drilling, MD. Schedule an appointment as soon as possible for a visit in 2 weeks.   Specialty:  Orthopedic Surgery   Contact information:   [address] [place] [address] [phone]       Signed: Patrica Duel 07/07/2013, 10:47 AM

## 2013-07-05 NOTE — Evaluation (Signed)
Occupational Therapy Evaluation Patient Details Name: Melvin Hamilton MRN: [id number] DOB: 30-Apr-1946 Today's Date: 07/05/2013 Time: 4098-1191 OT Time Calculation (min): 21 min  OT Assessment / Plan / Recommendation History of present illness s/p L TKA 9/8   Clinical Impression   Pt was admitted for L TKA. All education was completed.  Pt does not need any further OT    OT Assessment  Patient does not need any further OT services    Follow Up Recommendations  No OT follow up    Barriers to Discharge      Equipment Recommendations  None recommended by OT    Recommendations for Other Services    Frequency       Precautions / Restrictions Precautions Precautions: Fall;Knee Required Braces or Orthoses: Knee Immobilizer - Left Knee Immobilizer - Left: Discontinue once straight leg raise with < 10 degree lag Restrictions LLE Weight Bearing: Weight bearing as tolerated   Pertinent Vitals/Pain No c/o pain    ADL  Grooming: Wash/dry face;Supervision/safety Where Assessed - Grooming: Supported standing Lower Body Dressing: Supervision/safety (pants only) Where Assessed - Lower Body Dressing: Supported sit to Pharmacist, hospital: Buyer, retail Method: Sit to stand (bed, bathroom, bed) Tub/Shower Transfer: Supervision/safety Tub/Shower Transfer Method: Science writer: Walk in shower Transfers/Ambulation Related to ADLs: ambulated without cues except for hand position for sit to stand ADL Comments: able to don pants.  Wife will help with socks/shoes as needed.  Pt has high commode and shower seat.    OT Diagnosis:    OT Problem List:   OT Treatment Interventions:     OT Goals(Current goals can be found in the care plan section)    Visit Information  Last OT Received On: 07/05/13 Assistance Needed: +1 History of Present Illness: s/p L TKA 9/8       Prior Functioning     Home Living Family/patient expects to be  discharged to:: Private residence Living Arrangements: Spouse/significant other Prior Function Level of Independence: Independent Communication Communication: No difficulties         Vision/Perception     Cognition  Cognition Arousal/Alertness: Awake/alert Behavior During Therapy: WFL for tasks assessed/performed Overall Cognitive Status: Within Functional Limits for tasks assessed    Extremity/Trunk Assessment Upper Extremity Assessment Upper Extremity Assessment: Overall WFL for tasks assessed     Mobility Bed Mobility Bed Mobility: Supine to Sit;Sit to Supine Supine to Sit: 5: Supervision;With rails;HOB elevated Transfers Sit to Stand: 5: Supervision Details for Transfer Assistance: cues for hand placement     Exercise     Balance     End of Session OT - End of Session Activity Tolerance: Patient tolerated treatment well Patient left: in bed;with call bell/phone within reach  GO     [facility] 07/05/2013, 8:40 AM Marica Otter, OTR/L [phone] 07/05/2013

## 2013-07-05 NOTE — Progress Notes (Signed)
Physical Therapy Treatment Patient Details Name: Melvin Hamilton MRN: [id number] DOB: [birth year] Today's Date: 07/05/2013 Time: 4098-1191 PT Time Calculation (min): 16 min  PT Assessment / Plan / Recommendation  History of Present Illness s/p L TKA 9/8   PT Comments   Mobilizing well. All education completed. Ready to d/c from PT standpoint.   Follow Up Recommendations  Home health PT     Does the patient have the potential to tolerate intense rehabilitation     Barriers to Discharge        Equipment Recommendations  Rolling walker with 5" wheels    Recommendations for Other Services OT consult  Frequency 7X/week   Progress towards PT Goals Progress towards PT goals: Progressing toward goals  Plan Current plan remains appropriate    Precautions / Restrictions Precautions Precautions: Fall;Knee Required Braces or Orthoses: Knee Immobilizer - Left Knee Immobilizer - Left: Discontinue once straight leg raise with < 10 degree lag (KI DC'd 9/9) Restrictions Weight Bearing Restrictions: No LLE Weight Bearing: Weight bearing as tolerated   Pertinent Vitals/Pain 5/10 L knee. Ice applied end of session    Mobility  Bed Mobility Bed Mobility: Supine to Sit;Sit to Supine Supine to Sit: 5: Supervision;HOB elevated Sit to Supine: 5: Supervision Transfers Transfers: Sit to Stand;Stand to Sit Sit to Stand: 5: Supervision;From bed Stand to Sit: 5: Supervision;To bed Details for Transfer Assistance: cues for hand placement Ambulation/Gait Ambulation/Gait Assistance: 5: Supervision Ambulation Distance (Feet): 165 Feet Assistive device: Rolling walker Ambulation/Gait Assistance Details: VCs safety.  Gait Pattern: Step-through pattern;Antalgic Stairs: Yes Stairs Assistance: 4: Min guard Stairs Assistance Details (indicate cue type and reason): VCs safety, technique, sequence.  Stair Management Technique: One rail Left;Step to pattern;Forwards;With crutches Number of Stairs: 2     Exercises Total Joint Exercises Ankle Circles/Pumps: AROM;Both;15 reps;Supine Quad Sets: AROM;Both;15 reps;Supine Heel Slides: AAROM;Left;10 reps;Supine Hip ABduction/ADduction: Left;10 reps;Supine;AROM Straight Leg Raises: AROM;Left;10 reps;Supine   PT Diagnosis:    PT Problem List:   PT Treatment Interventions:     PT Goals (current goals can now be found in the care plan section)    Visit Information  Last PT Received On: 07/05/13 Assistance Needed: +1 History of Present Illness: s/p L TKA 9/8    Subjective Data      Cognition  Cognition Arousal/Alertness: Awake/alert Behavior During Therapy: [facility] for tasks assessed/performed Overall Cognitive Status: Within Functional Limits for tasks assessed    Balance     End of Session PT - End of Session Equipment Utilized During Treatment: Gait belt Activity Tolerance: Patient tolerated treatment well Patient left: in bed;with call bell/phone within reach   GP     Rebeca Alert, MPT Pager: [phone]

## 2013-07-05 NOTE — Progress Notes (Signed)
[facility]  [facility] is providing the following services: rw  If patient discharges after hours, please call [phone].   Renard Hamper 07/05/2013, 11:58 AM

## 2013-07-05 NOTE — Progress Notes (Signed)
Physical Therapy Treatment Patient Details Name: Melvin Hamilton MRN: [id number] DOB: 17-Dec-1945 Today's Date: 07/05/2013 Time: 4098-1191 PT Time Calculation (min): 16 min  PT Assessment / Plan / Recommendation  History of Present Illness s/p L TKA 9/8   PT Comments   Progressing well with mobility. Plan is for d/c home later today. Will have 2nd session to practice steps.   Follow Up Recommendations  Home health PT     Does the patient have the potential to tolerate intense rehabilitation     Barriers to Discharge        Equipment Recommendations  Rolling walker with 5" wheels    Recommendations for Other Services OT consult  Frequency 7X/week   Progress towards PT Goals Progress towards PT goals: Progressing toward goals  Plan Current plan remains appropriate    Precautions / Restrictions Precautions Precautions: Fall;Knee Required Braces or Orthoses: Knee Immobilizer - Left Knee Immobilizer - Left: Discontinue once straight leg raise with < 10 degree lag (KI DC'd 9/9) Restrictions Weight Bearing Restrictions: No LLE Weight Bearing: Weight bearing as tolerated   Pertinent Vitals/Pain L knee 6/10    Mobility  Bed Mobility Bed Mobility: Supine to Sit;Sit to Supine Supine to Sit: 5: Supervision Sit to Supine: 5: Supervision Transfers Transfers: Sit to Stand;Stand to Sit Sit to Stand: 5: Supervision;From bed Stand to Sit: 5: Supervision;To bed Details for Transfer Assistance: cues for hand placement Ambulation/Gait Ambulation/Gait Assistance: 4: Min guard Ambulation Distance (Feet): 150 Feet Assistive device: Rolling walker Ambulation/Gait Assistance Details: VCs safety.  Gait Pattern: Step-through pattern;Antalgic    Exercises Total Joint Exercises Ankle Circles/Pumps: AROM;Both;15 reps;Supine Quad Sets: AROM;Both;15 reps;Supine Heel Slides: AAROM;Left;10 reps;Supine Hip ABduction/ADduction: Left;10 reps;Supine;AROM Straight Leg Raises: AROM;Left;10 reps;Supine    PT Diagnosis:    PT Problem List:   PT Treatment Interventions:     PT Goals (current goals can now be found in the care plan section)    Visit Information  Last PT Received On: 07/05/13 Assistance Needed: +1 History of Present Illness: s/p L TKA 9/8    Subjective Data      Cognition  Cognition Arousal/Alertness: Awake/alert Behavior During Therapy: [facility] for tasks assessed/performed Overall Cognitive Status: Within Functional Limits for tasks assessed    Balance     End of Session PT - End of Session Activity Tolerance: Patient tolerated treatment well Patient left: in bed;with call bell/phone within reach CPM Left Knee CPM Left Knee: Off Left Knee Flexion (Degrees): 40 Left Knee Extension (Degrees): 10   GP     Rebeca Alert, MPT Pager: [phone]

## 2013-07-05 NOTE — Progress Notes (Signed)
   Subjective: 1 Day Post-Op Procedure(s) (LRB): LEFT TOTAL KNEE ARTHROPLASTY (Left) Patient reports pain as mild.   Patient seen in rounds with Dr. Lequita Halt. Patient is well, and has had no acute complaints or problems We will start therapy today.  Plan is to go Home after hospital stay.  Objective: Vital signs in last 24 hours: Temp:  [97.5 F (36.4 C)-99.1 F (37.3 C)] 98 F (36.7 C) (09/09 0652) Pulse Rate:  [49-96] 60 (09/09 0652) Resp:  [13-16] 16 (09/09 0652) BP: (106-152)/(59-83) 132/80 mmHg (09/09 0652) SpO2:  [98 %-100 %] 98 % (09/09 0652) Weight:  [102.059 kg (225 lb)] 102.059 kg (225 lb) (09/08 1442)  Intake/Output from previous day:  Intake/Output Summary (Last 24 hours) at 07/05/13 0844 Last data filed at 07/05/13 0653  Gross per 24 hour  Intake   3910 ml  Output   5130 ml  Net  -1220 ml    Intake/Output this shift:    Labs:  Recent Labs  07/05/13 0410  HGB 11.3*    Recent Labs  07/05/13 0410  WBC 15.0*  RBC 3.90*  HCT 34.4*  PLT 221    Recent Labs  07/05/13 0410  NA 133*  K 4.2  CL 100  CO2 26  BUN 12  CREATININE 0.83  GLUCOSE 145*  CALCIUM 8.7   No results found for this basename: LABPT, INR,  in the last 72 hours  EXAM General - Patient is Alert, Appropriate and Oriented Extremity - Neurovascular intact Sensation intact distally Dorsiflexion/Plantar flexion intact Dressing - dressing C/D/I Motor Function - intact, moving foot and toes well on exam.  Hemovac pulled without difficulty.  Past Medical History  Diagnosis Date  . Hypertension   . H/O sleep apnea   . Dermatitis   . Acne   . Sleep apnea   . Arthritis     oa left knee  . 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   Weight as of this encounter: 102.059 kg (225 lb). Up with therapy Plan for discharge tomorrow Discharge home with home health Possible chance home later today if meets all goals and doing very well.  DVT Prophylaxis - Xarelto Weight-Bearing as tolerated to left leg No vaccines. D/C O2 and Pulse OX and try on Room [address]  Patrica Duel 07/05/2013, 8:44 AM

## 2013-07-20 ENCOUNTER — Ambulatory Visit: Payer: Medicare Other | Attending: Orthopedic Surgery | Admitting: Physical Therapy

## 2013-07-20 DIAGNOSIS — IMO0001 Reserved for inherently not codable concepts without codable children: Secondary | ICD-10-CM | POA: Insufficient documentation

## 2013-07-20 DIAGNOSIS — M25569 Pain in unspecified knee: Secondary | ICD-10-CM | POA: Insufficient documentation

## 2013-07-20 DIAGNOSIS — M25669 Stiffness of unspecified knee, not elsewhere classified: Secondary | ICD-10-CM | POA: Insufficient documentation

## 2013-07-20 DIAGNOSIS — Z96659 Presence of unspecified artificial knee joint: Secondary | ICD-10-CM | POA: Insufficient documentation

## 2013-07-20 DIAGNOSIS — M6281 Muscle weakness (generalized): Secondary | ICD-10-CM | POA: Insufficient documentation

## 2013-07-22 ENCOUNTER — Ambulatory Visit: Payer: Medicare Other | Admitting: Physical Therapy

## 2013-07-25 ENCOUNTER — Ambulatory Visit: Payer: Medicare Other | Admitting: Physical Therapy

## 2013-07-27 ENCOUNTER — Ambulatory Visit: Payer: Medicare Other | Attending: Orthopedic Surgery | Admitting: Physical Therapy

## 2013-07-27 DIAGNOSIS — IMO0001 Reserved for inherently not codable concepts without codable children: Secondary | ICD-10-CM | POA: Insufficient documentation

## 2013-07-27 DIAGNOSIS — M6281 Muscle weakness (generalized): Secondary | ICD-10-CM | POA: Insufficient documentation

## 2013-07-27 DIAGNOSIS — M25669 Stiffness of unspecified knee, not elsewhere classified: Secondary | ICD-10-CM | POA: Insufficient documentation

## 2013-07-27 DIAGNOSIS — Z96659 Presence of unspecified artificial knee joint: Secondary | ICD-10-CM | POA: Insufficient documentation

## 2013-07-27 DIAGNOSIS — M25569 Pain in unspecified knee: Secondary | ICD-10-CM | POA: Insufficient documentation

## 2013-07-29 ENCOUNTER — Ambulatory Visit: Payer: Medicare Other | Admitting: Physical Therapy

## 2013-08-01 ENCOUNTER — Ambulatory Visit: Payer: Medicare Other | Admitting: Physical Therapy

## 2013-08-03 ENCOUNTER — Encounter: Payer: Medicare Other | Admitting: Physical Therapy

## 2013-08-05 ENCOUNTER — Ambulatory Visit: Payer: Medicare Other | Admitting: Physical Therapy

## 2013-08-08 ENCOUNTER — Ambulatory Visit: Payer: Medicare Other | Admitting: Physical Therapy

## 2013-08-09 ENCOUNTER — Ambulatory Visit: Payer: Medicare Other | Admitting: Physical Therapy

## 2013-10-17 ENCOUNTER — Other Ambulatory Visit: Payer: Self-pay | Admitting: Internal Medicine

## 2013-10-17 NOTE — Telephone Encounter (Signed)
Please advise if okay to send Rx; last OV 05-2013 and looks as though we did not Rx for him originally. Thanks.

## 2013-10-17 NOTE — Telephone Encounter (Signed)
Ok to refill 

## 2013-10-18 NOTE — Telephone Encounter (Signed)
Called refill to pharmacy voicemail.  

## 2013-11-22 ENCOUNTER — Telehealth: Payer: Self-pay | Admitting: Internal Medicine

## 2013-11-22 NOTE — Telephone Encounter (Signed)
Spoke with patients insurance-Ambien Rx has been approved from 10-07-13 through 10-07-14; pt and pharmacy made aware of approval. Paper PA form sent to scan in EPIC.

## 2013-11-22 NOTE — Telephone Encounter (Signed)
Florentina AddisonKatie can you look and see if you have a PA for for this patient? If so we can do PA. Thanks. Carron CurieJennifer Travaris Kosh, CMA

## 2013-12-06 ENCOUNTER — Ambulatory Visit (INDEPENDENT_AMBULATORY_CARE_PROVIDER_SITE_OTHER): Payer: Medicare Other | Admitting: General Surgery

## 2013-12-06 ENCOUNTER — Encounter (INDEPENDENT_AMBULATORY_CARE_PROVIDER_SITE_OTHER): Payer: Self-pay | Admitting: General Surgery

## 2013-12-06 VITALS — BP 118/84 | HR 78 | Resp 16 | Ht 68.5 in | Wt 223.8 lb

## 2013-12-06 DIAGNOSIS — K649 Unspecified hemorrhoids: Secondary | ICD-10-CM | POA: Insufficient documentation

## 2013-12-06 NOTE — Progress Notes (Signed)
Subjective:     Patient ID: Melvin Hamilton, male   DOB: 10/13/1946, 68 y.o.   MRN: [id number]  HPI The patient was referred to me because of findings on recent colonoscopy. This colonoscopy was done because he had had some difficulty keeping his perianal area clean after a bowel movement. He has no pain, bleeding, or significant pruritus.  Review of Systems Occasional itching.    Objective:   Physical Exam On external perianal examination the patient has no skin tags or external hemorrhoids. On digital examination the patient's sphincter appears to be somewhat weakened however it is completely intact without palpable thrombosed hemorrhoids. On anoscopy at approximately the 2:00 position the patient has a moderate internal hemorrhoid without bleeding or friability. There is no evidence of any significant internal hemorrhoidal disease.      Assessment:     The patient has minimal hemorrhoidal disease it does not require surgical management.     Plan:     If he should require treatment and perhaps Anusol HC Suppository scan be used. However since the patient is asymptomatic no treatment is recommended at this time.

## 2014-05-30 ENCOUNTER — Other Ambulatory Visit: Payer: Self-pay | Admitting: Otolaryngology

## 2014-05-30 DIAGNOSIS — J0101 Acute recurrent maxillary sinusitis: Secondary | ICD-10-CM

## 2014-06-02 ENCOUNTER — Ambulatory Visit
Admission: RE | Admit: 2014-06-02 | Discharge: 2014-06-02 | Disposition: A | Discharge status: Home / Self Care | Payer: Medicare Other | Source: Ambulatory Visit | Attending: Otolaryngology | Admitting: Otolaryngology

## 2014-06-02 DIAGNOSIS — J0101 Acute recurrent maxillary sinusitis: Secondary | ICD-10-CM

## 2016-01-02 ENCOUNTER — Other Ambulatory Visit (HOSPITAL_COMMUNITY): Payer: Self-pay | Admitting: Orthopedic Surgery

## 2016-01-02 DIAGNOSIS — M25562 Pain in left knee: Secondary | ICD-10-CM

## 2016-01-10 ENCOUNTER — Ambulatory Visit (HOSPITAL_COMMUNITY)
Admission: RE | Admit: 2016-01-10 | Discharge: 2016-01-10 | Disposition: A | Discharge status: Home / Self Care | Payer: Medicare Other | Source: Ambulatory Visit | Attending: Orthopedic Surgery | Admitting: Orthopedic Surgery

## 2016-01-10 DIAGNOSIS — M25562 Pain in left knee: Secondary | ICD-10-CM | POA: Insufficient documentation

## 2016-01-10 DIAGNOSIS — Z9889 Other specified postprocedural states: Secondary | ICD-10-CM | POA: Insufficient documentation

## 2016-01-10 MED ORDER — TECHNETIUM TC 99M MEDRONATE IV KIT
23.3000 | PACK | Freq: Once | INTRAVENOUS | Status: AC | PRN
  Administered 2016-01-10: 23.3 via INTRAVENOUS

## 2016-03-07 ENCOUNTER — Other Ambulatory Visit: Payer: Self-pay | Admitting: Orthopedic Surgery

## 2016-03-07 DIAGNOSIS — M5416 Radiculopathy, lumbar region: Secondary | ICD-10-CM

## 2016-03-14 ENCOUNTER — Ambulatory Visit
Admission: RE | Admit: 2016-03-14 | Discharge: 2016-03-14 | Disposition: A | Discharge status: Home / Self Care | Payer: Medicare Other | Source: Ambulatory Visit | Attending: Orthopedic Surgery | Admitting: Orthopedic Surgery

## 2016-03-14 DIAGNOSIS — M5416 Radiculopathy, lumbar region: Secondary | ICD-10-CM

## 2017-03-17 ENCOUNTER — Other Ambulatory Visit: Payer: Self-pay | Admitting: Dermatology

## 2017-12-02 ENCOUNTER — Ambulatory Visit (HOSPITAL_COMMUNITY)
Admission: RE | Admit: 2017-12-02 | Discharge: 2017-12-02 | Disposition: A | Discharge status: Home / Self Care | Payer: Medicare Other | Source: Ambulatory Visit | Attending: Internal Medicine | Admitting: Internal Medicine

## 2017-12-02 ENCOUNTER — Other Ambulatory Visit (HOSPITAL_COMMUNITY): Payer: Self-pay | Admitting: Internal Medicine

## 2017-12-02 DIAGNOSIS — R0602 Shortness of breath: Secondary | ICD-10-CM | POA: Insufficient documentation

## 2017-12-02 DIAGNOSIS — Z0000 Encounter for general adult medical examination without abnormal findings: Secondary | ICD-10-CM

## 2019-11-16 ENCOUNTER — Ambulatory Visit: Payer: Medicare Other | Attending: Internal Medicine

## 2019-11-16 DIAGNOSIS — Z23 Encounter for immunization: Secondary | ICD-10-CM | POA: Insufficient documentation

## 2019-11-16 NOTE — Progress Notes (Signed)
   Covid-19 Vaccination Clinic  Name:  TINY RIETZ    MRN: [id number] DOB: 09-03-1946  11/16/2019  Mr. Mcnease was observed post Covid-19 immunization for 15 minutes without incidence. He was provided with Vaccine Information Sheet and instruction to access the V-Safe system.   Mr. Donahoe was instructed to call 911 with any severe reactions post vaccine: Marland Kitchen Difficulty breathing  . Swelling of your face and throat  . A fast heartbeat  . A bad rash all over your body  . Dizziness and weakness    Immunizations Administered    Name Date Dose VIS Date Route   Pfizer COVID-19 Vaccine 11/16/2019  5:54 PM 0.3 mL 10/07/2019 Intramuscular   Manufacturer: [employer], [employer]   Lot: [id number]   NDC: [address]-5

## 2019-12-07 ENCOUNTER — Ambulatory Visit: Payer: Medicare Other | Attending: Internal Medicine

## 2019-12-07 DIAGNOSIS — Z23 Encounter for immunization: Secondary | ICD-10-CM

## 2019-12-07 NOTE — Progress Notes (Signed)
   Covid-19 Vaccination Clinic  Name:  WYAT INFINGER    MRN: [id number] DOB: 1945/12/05  12/07/2019  Mr. Diodato was observed post Covid-19 immunization for 15 minutes without incidence. He was provided with Vaccine Information Sheet and instruction to access the V-Safe system.   Mr. Newbern was instructed to call 911 with any severe reactions post vaccine: Marland Kitchen Difficulty breathing  . Swelling of your face and throat  . A fast heartbeat  . A bad rash all over your body  . Dizziness and weakness    Immunizations Administered    Name Date Dose VIS Date Route   Pfizer COVID-19 Vaccine 12/07/2019  9:38 AM 0.3 mL 10/07/2019 Intramuscular   Manufacturer: [employer], [employer]   Lot: [id number]   NDC: [address]-1

## 2020-01-12 ENCOUNTER — Other Ambulatory Visit: Payer: Self-pay | Admitting: Orthopedic Surgery

## 2020-01-23 ENCOUNTER — Other Ambulatory Visit: Payer: Self-pay | Admitting: Orthopedic Surgery

## 2020-01-30 NOTE — Progress Notes (Signed)
PCP - Hassie Bruce Cardiologist -   Chest x-ray -  EKG -  Stress Test -  ECHO -  Cardiac Cath -   Sleep Study -  CPAP -   Fasting Blood Sugar -  Checks Blood Sugar _____ times a day  Blood Thinner Instructions: Aspirin Instructions: Last Dose:  Anesthesia review:   Patient denies shortness of breath, fever, cough and chest pain at PAT appointment   Patient verbalized understanding of instructions that were given to them at the PAT appointment. Patient was also instructed that they will need to review over the PAT instructions again at home before surgery.

## 2020-01-30 NOTE — Patient Instructions (Addendum)
DUE TO COVID-19 ONLY ONE VISITOR IS ALLOWED TO COME WITH YOU AND STAY IN THE WAITING ROOM ONLY DURING PRE OP AND PROCEDURE DAY OF SURGERY. THE 1 VISITOR MAY VISIT WITH YOU AFTER SURGERY IN YOUR PRIVATE ROOM DURING VISITING HOURS ONLY!  YOU NEED TO HAVE A COVID 19 TEST ON 02-06-20 @ 8:55 AM, THIS TEST MUST BE DONE BEFORE SURGERY, COME  [address], [place] , [address].  [facility]) ONCE YOUR COVID TEST IS COMPLETED, PLEASE BEGIN THE QUARANTINE INSTRUCTIONS AS OUTLINED IN YOUR HANDOUT.                Melvin Hamilton  01/30/2020   Your procedure is scheduled on:  02-09-20   Report to [facility] Main  Entrance    Report to admitting at 9:45 AM     Call this number if you have problems the morning of surgery [phone]    Remember: NO SOLID FOOD AFTER MIDNIGHT THE NIGHT PRIOR TO SURGERY. NOTHING BY MOUTH EXCEPT CLEAR LIQUIDS UNTIL 9:15 AM . PLEASE FINISH ENSURE DRINK PER SURGEON ORDER  WHICH NEEDS TO BE COMPLETED AT 9:15 AM.   CLEAR LIQUID DIET   Foods Allowed                                                                     Foods Excluded  Coffee and tea, regular and decaf                             liquids that you cannot  Plain Jell-O any favor except red or purple                                           see through such as: Fruit ices (not with fruit pulp)                                     milk, soups, orange juice  Iced Popsicles                                    All solid food Carbonated beverages, regular and diet                                    Cranberry, grape and apple juices Sports drinks like Gatorade Lightly seasoned clear broth or consume(fat free) Sugar, honey syrup   _____________________________________________________________________     Take these medicines the morning of surgery with A SIP OF WATER: Colchicine, prn  BRUSH YOUR TEETH MORNING OF SURGERY AND RINSE YOUR MOUTH OUT, NO CHEWING GUM CANDY OR MINTS.                                 You may not have any metal on your body including hair  pins and              piercings     Do not wear jewelry, cologne, lotions, powders or deodorant              Men may shave face and neck.   Do not bring valuables to the hospital. [place] IS NOT             RESPONSIBLE   FOR VALUABLES.  Contacts, dentures or bridgework may not be worn into surgery.      Patients discharged the day of surgery will not be allowed to drive home. IF YOU ARE HAVING SURGERY AND GOING HOME THE SAME DAY, YOU MUST HAVE AN ADULT TO DRIVE YOU HOME AND BE WITH YOU FOR 24 HOURS. YOU MAY GO HOME BY TAXI OR UBER OR ORTHERWISE, BUT AN ADULT MUST ACCOMPANY YOU HOME AND STAY WITH YOU FOR 24 HOURS.  Name and phone number of your driver: Johnel Yielding [phone]  Special Instructions: N/A              Please read over the following fact sheets you were given: _____________________________________________________________________  [facility]- Preparing for Total Shoulder Arthroplasty    Before surgery, you can play an important role. Because skin is not sterile, your skin needs to be as free of germs as possible. You can reduce the number of germs on your skin by using the following products. . Benzoyl Peroxide Gel o Reduces the number of germs present on the skin o Applied twice a day to shoulder area starting two days before surgery    ==================================================================  Please follow these instructions carefully:  BENZOYL PEROXIDE 5% GEL  Please do not use if you have an allergy to benzoyl peroxide.   If your skin becomes reddened/irritated stop using the benzoyl peroxide.  Starting two days before surgery, apply as follows: 1. Apply benzoyl peroxide in the morning and at night. Apply after taking a shower. If you are not taking a shower clean entire shoulder front, back, and side along with the armpit with a clean wet washcloth.  2. Place a  quarter-sized dollop on your shoulder and rub in thoroughly, making sure to cover the front, back, and side of your shoulder, along with the armpit.   2 days before ____ AM   ____ PM              1 day before ____ AM   ____ PM                         3. Do this twice a day for two days.  (Last application is the night before surgery, AFTER using the CHG soap as described below).  4. Do NOT apply benzoyl peroxide gel on the day of surgery.            [facility] - Preparing for Surgery Before surgery, you can play an important role.  Because skin is not sterile, your skin needs to be as free of germs as possible.  You can reduce the number of germs on your skin by washing with CHG (chlorahexidine gluconate) soap before surgery.  CHG is an antiseptic cleaner which kills germs and bonds with the skin to continue killing germs even after washing. Please DO NOT use if you have an allergy to CHG or antibacterial soaps.  If your skin becomes reddened/irritated stop using the CHG and inform your nurse when you arrive  at Short Stay. Do not shave (including legs and underarms) for at least 48 hours prior to the first CHG shower.  You may shave your face/neck. Please follow these instructions carefully:  1.  Shower with CHG Soap the night before surgery and the  morning of Surgery.  2.  If you choose to wash your hair, wash your hair first as usual with your  normal  shampoo.  3.  After you shampoo, rinse your hair and body thoroughly to remove the  shampoo.                           4.  Use CHG as you would any other liquid soap.  You can apply chg directly  to the skin and wash                       Gently with a scrungie or clean washcloth.  5.  Apply the CHG Soap to your body ONLY FROM THE NECK DOWN.   Do not use on face/ open                           Wound or open sores. Avoid contact with eyes, ears mouth and genitals (private parts).                       Wash face,  Genitals (private parts) with  your normal soap.             6.  Wash thoroughly, paying special attention to the area where your surgery  will be performed.  7.  Thoroughly rinse your body with warm water from the neck down.  8.  DO NOT shower/wash with your normal soap after using and rinsing off  the CHG Soap.                9.  Pat yourself dry with a clean towel.            10.  Wear clean pajamas.            11.  Place clean sheets on your bed the night of your first shower and do not  sleep with pets. Day of Surgery : Do not apply any lotions/deodorants the morning of surgery.  Please wear clean clothes to the hospital/surgery center.  FAILURE TO FOLLOW THESE INSTRUCTIONS MAY RESULT IN THE CANCELLATION OF YOUR SURGERY PATIENT SIGNATURE_________________________________  NURSE SIGNATURE__________________________________  ________________________________________________________________________

## 2020-01-31 ENCOUNTER — Other Ambulatory Visit: Payer: Self-pay

## 2020-01-31 ENCOUNTER — Encounter (HOSPITAL_COMMUNITY)
Admission: RE | Admit: 2020-01-31 | Discharge: 2020-01-31 | Disposition: A | Discharge status: Home / Self Care | Payer: Medicare Other | Source: Ambulatory Visit | Attending: Orthopedic Surgery | Admitting: Orthopedic Surgery

## 2020-01-31 ENCOUNTER — Encounter (HOSPITAL_COMMUNITY): Payer: Self-pay

## 2020-02-01 ENCOUNTER — Ambulatory Visit (HOSPITAL_COMMUNITY)
Admission: RE | Admit: 2020-02-01 | Discharge: 2020-02-01 | Disposition: A | Discharge status: Home / Self Care | Payer: Medicare Other | Source: Ambulatory Visit | Attending: Orthopedic Surgery | Admitting: Orthopedic Surgery

## 2020-02-01 ENCOUNTER — Encounter (HOSPITAL_COMMUNITY)
Admission: RE | Admit: 2020-02-01 | Discharge: 2020-02-01 | Disposition: A | Discharge status: Home / Self Care | Payer: Medicare Other | Source: Ambulatory Visit | Attending: Orthopedic Surgery | Admitting: Orthopedic Surgery

## 2020-02-01 DIAGNOSIS — Z01811 Encounter for preprocedural respiratory examination: Secondary | ICD-10-CM

## 2020-02-01 DIAGNOSIS — Z01818 Encounter for other preprocedural examination: Secondary | ICD-10-CM | POA: Diagnosis present

## 2020-02-01 DIAGNOSIS — R9431 Abnormal electrocardiogram [ECG] [EKG]: Secondary | ICD-10-CM | POA: Diagnosis not present

## 2020-02-01 LAB — URINALYSIS, ROUTINE W REFLEX MICROSCOPIC
Bilirubin Urine: NEGATIVE
Glucose, UA: NEGATIVE mg/dL
Hgb urine dipstick: NEGATIVE
Ketones, ur: NEGATIVE mg/dL
Leukocytes,Ua: NEGATIVE
Nitrite: NEGATIVE
Protein, ur: NEGATIVE mg/dL
Specific Gravity, Urine: 1.015 (ref 1.005–1.030)
pH: 6 (ref 5.0–8.0)

## 2020-02-01 LAB — PROTIME-INR
INR: 1.1 (ref 0.8–1.2)
Prothrombin Time: 13.9 seconds (ref 11.4–15.2)

## 2020-02-01 LAB — COMPREHENSIVE METABOLIC PANEL
ALT: 19 U/L (ref 0–44)
AST: 19 U/L (ref 15–41)
Albumin: 4.1 g/dL (ref 3.5–5.0)
Alkaline Phosphatase: 82 U/L (ref 38–126)
Anion gap: 7 (ref 5–15)
BUN: 17 mg/dL (ref 8–23)
CO2: 29 mmol/L (ref 22–32)
Calcium: 9.1 mg/dL (ref 8.9–10.3)
Chloride: 105 mmol/L (ref 98–111)
Creatinine, Ser: 0.83 mg/dL (ref 0.61–1.24)
GFR calc Af Amer: >60 mL/min (ref >60)
GFR calc non Af Amer: >60 mL/min (ref >60)
Glucose, Bld: 106 mg/dL — ABNORMAL HIGH (ref 70–99)
Potassium: 4.3 mmol/L (ref 3.5–5.1)
Sodium: 141 mmol/L (ref 135–145)
Total Bilirubin: 0.9 mg/dL (ref 0.3–1.2)
Total Protein: 7.2 g/dL (ref 6.5–8.1)

## 2020-02-01 LAB — CBC WITH DIFFERENTIAL/PLATELET
Abs Immature Granulocytes: 0.01 10*3/uL (ref 0.00–0.07)
Basophils Absolute: 0 10*3/uL (ref 0.0–0.1)
Basophils Relative: 1 %
Eosinophils Absolute: 0.3 10*3/uL (ref 0.0–0.5)
Eosinophils Relative: 5 %
HCT: 42.6 % (ref 39.0–52.0)
Hemoglobin: 13.2 g/dL (ref 13.0–17.0)
Immature Granulocytes: 0 %
Lymphocytes Relative: 22 %
Lymphs Abs: 1.4 10*3/uL (ref 0.7–4.0)
MCH: 27.1 pg (ref 26.0–34.0)
MCHC: 31 g/dL (ref 30.0–36.0)
MCV: 87.5 fL (ref 80.0–100.0)
Monocytes Absolute: 0.7 10*3/uL (ref 0.1–1.0)
Monocytes Relative: 11 %
Neutro Abs: 4 10*3/uL (ref 1.7–7.7)
Neutrophils Relative %: 61 %
Platelets: 210 10*3/uL (ref 150–400)
RBC: 4.87 MIL/uL (ref 4.22–5.81)
RDW: 16.7 % — ABNORMAL HIGH (ref 11.5–15.5)
WBC: 6.5 10*3/uL (ref 4.0–10.5)
nRBC: 0 % (ref 0.0–0.2)

## 2020-02-01 LAB — SURGICAL PCR SCREEN
MRSA, PCR: NEGATIVE
Staphylococcus aureus: NEGATIVE

## 2020-02-01 LAB — APTT: aPTT: 27 seconds (ref 24–36)

## 2020-02-06 ENCOUNTER — Other Ambulatory Visit (HOSPITAL_COMMUNITY)
Admission: RE | Admit: 2020-02-06 | Discharge: 2020-02-06 | Disposition: A | Discharge status: Home / Self Care | Payer: Medicare Other | Source: Ambulatory Visit | Attending: Orthopedic Surgery | Admitting: Orthopedic Surgery

## 2020-02-06 DIAGNOSIS — Z20822 Contact with and (suspected) exposure to covid-19: Secondary | ICD-10-CM | POA: Insufficient documentation

## 2020-02-06 DIAGNOSIS — Z01812 Encounter for preprocedural laboratory examination: Secondary | ICD-10-CM | POA: Insufficient documentation

## 2020-02-06 LAB — SARS CORONAVIRUS 2 (TAT 6-24 HRS): SARS Coronavirus 2: NEGATIVE

## 2020-02-09 ENCOUNTER — Ambulatory Visit (HOSPITAL_COMMUNITY): Payer: Medicare Other | Admitting: Anesthesiology

## 2020-02-09 ENCOUNTER — Ambulatory Visit (HOSPITAL_COMMUNITY): Payer: Medicare Other

## 2020-02-09 ENCOUNTER — Encounter (HOSPITAL_COMMUNITY)
Admission: RE | Disposition: A | Discharge status: Home / Self Care | Payer: Self-pay | Source: Home / Self Care | Attending: Orthopedic Surgery

## 2020-02-09 ENCOUNTER — Ambulatory Visit (HOSPITAL_COMMUNITY)
Admission: RE | Admit: 2020-02-09 | Discharge: 2020-02-09 | Disposition: A | Discharge status: Home / Self Care | Payer: Medicare Other | Attending: Orthopedic Surgery | Admitting: Orthopedic Surgery

## 2020-02-09 ENCOUNTER — Encounter (HOSPITAL_COMMUNITY): Payer: Self-pay | Admitting: Orthopedic Surgery

## 2020-02-09 DIAGNOSIS — G4730 Sleep apnea, unspecified: Secondary | ICD-10-CM | POA: Diagnosis not present

## 2020-02-09 DIAGNOSIS — Z888 Allergy status to other drugs, medicaments and biological substances status: Secondary | ICD-10-CM | POA: Insufficient documentation

## 2020-02-09 DIAGNOSIS — Z96652 Presence of left artificial knee joint: Secondary | ICD-10-CM | POA: Insufficient documentation

## 2020-02-09 DIAGNOSIS — I10 Essential (primary) hypertension: Secondary | ICD-10-CM | POA: Diagnosis not present

## 2020-02-09 DIAGNOSIS — M19011 Primary osteoarthritis, right shoulder: Secondary | ICD-10-CM | POA: Diagnosis not present

## 2020-02-09 DIAGNOSIS — Z885 Allergy status to narcotic agent status: Secondary | ICD-10-CM | POA: Insufficient documentation

## 2020-02-09 DIAGNOSIS — Z96611 Presence of right artificial shoulder joint: Secondary | ICD-10-CM

## 2020-02-09 HISTORY — PX: TOTAL SHOULDER ARTHROPLASTY: SHX126

## 2020-02-09 LAB — TYPE AND SCREEN
ABO/RH(D): O POS
Antibody Screen: NEGATIVE

## 2020-02-09 SURGERY — ARTHROPLASTY, SHOULDER, TOTAL
Anesthesia: General | Site: Shoulder | Laterality: Right

## 2020-02-09 MED ORDER — FENTANYL CITRATE (PF) 100 MCG/2ML IJ SOLN
50.0000 ug | INTRAMUSCULAR | Status: DC
  Administered 2020-02-09: 11:00:00 100 ug via INTRAVENOUS
  Filled 2020-02-09: qty 2

## 2020-02-09 MED ORDER — CEFAZOLIN SODIUM-DEXTROSE 2-4 GM/100ML-% IV SOLN
2.0000 g | INTRAVENOUS | Status: AC
  Administered 2020-02-09: 2 g via INTRAVENOUS
  Filled 2020-02-09: qty 100

## 2020-02-09 MED ORDER — LIDOCAINE 2% (20 MG/ML) 5 ML SYRINGE
INTRAMUSCULAR | Status: DC | PRN
  Administered 2020-02-09: 50 mg via INTRAVENOUS

## 2020-02-09 MED ORDER — SODIUM CHLORIDE 0.9 % IR SOLN
Status: DC | PRN
  Administered 2020-02-09: 1000 mL

## 2020-02-09 MED ORDER — LACTATED RINGERS IV SOLN: INTRAVENOUS | Status: DC

## 2020-02-09 MED ORDER — CELECOXIB 200 MG PO CAPS
200.0000 mg | ORAL_CAPSULE | Freq: Once | ORAL | Status: AC
  Administered 2020-02-09: 200 mg via ORAL

## 2020-02-09 MED ORDER — MIDAZOLAM HCL 2 MG/2ML IJ SOLN
1.0000 mg | INTRAMUSCULAR | Status: DC
  Administered 2020-02-09: 1 mg via INTRAVENOUS
  Filled 2020-02-09: qty 2

## 2020-02-09 MED ORDER — GLYCOPYRROLATE PF 0.2 MG/ML IJ SOSY
PREFILLED_SYRINGE | INTRAMUSCULAR | Status: AC
  Filled 2020-02-09: qty 1

## 2020-02-09 MED ORDER — PHENYLEPHRINE HCL-NACL 10-0.9 MG/250ML-% IV SOLN
INTRAVENOUS | Status: DC | PRN
  Administered 2020-02-09: 55 ug/min via INTRAVENOUS

## 2020-02-09 MED ORDER — GLYCOPYRROLATE PF 0.2 MG/ML IJ SOSY
PREFILLED_SYRINGE | INTRAMUSCULAR | Status: DC | PRN
  Administered 2020-02-09: .2 mg via INTRAVENOUS

## 2020-02-09 MED ORDER — TIZANIDINE HCL 4 MG PO TABS: 4.0000 mg | ORAL_TABLET | Freq: Three times a day (TID) | ORAL | 1 refills | Status: AC | PRN

## 2020-02-09 MED ORDER — ROCURONIUM BROMIDE 10 MG/ML (PF) SYRINGE
PREFILLED_SYRINGE | INTRAVENOUS | Status: DC | PRN
  Administered 2020-02-09: 80 mg via INTRAVENOUS

## 2020-02-09 MED ORDER — ACETAMINOPHEN 500 MG PO TABS
1000.0000 mg | ORAL_TABLET | Freq: Once | ORAL | Status: AC
  Administered 2020-02-09: 1000 mg via ORAL

## 2020-02-09 MED ORDER — PROPOFOL 10 MG/ML IV BOLUS
INTRAVENOUS | Status: DC | PRN
  Administered 2020-02-09: 160 mg via INTRAVENOUS

## 2020-02-09 MED ORDER — TRANEXAMIC ACID-NACL 1000-0.7 MG/100ML-% IV SOLN
1000.0000 mg | INTRAVENOUS | Status: AC
  Administered 2020-02-09: 1000 mg via INTRAVENOUS
  Filled 2020-02-09: qty 100

## 2020-02-09 MED ORDER — ACETAMINOPHEN 500 MG PO TABS
ORAL_TABLET | ORAL | Status: AC
  Filled 2020-02-09: qty 2

## 2020-02-09 MED ORDER — SUGAMMADEX SODIUM 500 MG/5ML IV SOLN
INTRAVENOUS | Status: AC
  Filled 2020-02-09: qty 5

## 2020-02-09 MED ORDER — BUPIVACAINE LIPOSOME 1.3 % IJ SUSP
INTRAMUSCULAR | Status: DC | PRN
  Administered 2020-02-09: 10 mL via PERINEURAL

## 2020-02-09 MED ORDER — OXYCODONE-ACETAMINOPHEN 5-325 MG PO TABS: ORAL_TABLET | ORAL | 0 refills | Status: DC

## 2020-02-09 MED ORDER — DEXAMETHASONE SODIUM PHOSPHATE 10 MG/ML IJ SOLN
INTRAMUSCULAR | Status: DC | PRN
  Administered 2020-02-09: 6 mg via INTRAVENOUS

## 2020-02-09 MED ORDER — FENTANYL CITRATE (PF) 100 MCG/2ML IJ SOLN: 25.0000 ug | INTRAMUSCULAR | Status: DC | PRN

## 2020-02-09 MED ORDER — CELECOXIB 200 MG PO CAPS
ORAL_CAPSULE | ORAL | Status: AC
  Filled 2020-02-09: qty 1

## 2020-02-09 MED ORDER — ONDANSETRON HCL 4 MG/2ML IJ SOLN
INTRAMUSCULAR | Status: DC | PRN
  Administered 2020-02-09: 4 mg via INTRAVENOUS

## 2020-02-09 MED ORDER — BUPIVACAINE HCL (PF) 0.5 % IJ SOLN
INTRAMUSCULAR | Status: DC | PRN
  Administered 2020-02-09: 15 mL via PERINEURAL

## 2020-02-09 MED ORDER — PROMETHAZINE HCL 25 MG/ML IJ SOLN: 6.2500 mg | INTRAMUSCULAR | Status: DC | PRN

## 2020-02-09 MED ORDER — STERILE WATER FOR IRRIGATION IR SOLN
Status: DC | PRN
  Administered 2020-02-09 (×2): 1000 mL

## 2020-02-09 MED ORDER — SUGAMMADEX SODIUM 500 MG/5ML IV SOLN
INTRAVENOUS | Status: DC | PRN
  Administered 2020-02-09: 450 mg via INTRAVENOUS

## 2020-02-09 MED ORDER — CHLORHEXIDINE GLUCONATE 4 % EX LIQD: 60.0000 mL | Freq: Once | CUTANEOUS | Status: DC

## 2020-02-09 MED ORDER — SODIUM CHLORIDE 0.9 % IV SOLN
INTRAVENOUS | Status: DC | PRN
  Administered 2020-02-09: 1000 mL via INTRAMUSCULAR

## 2020-02-09 SURGICAL SUPPLY — 82 items
AID PSTN UNV HD RSTRNT DISP (MISCELLANEOUS) ×1
APL PRP STRL LF DISP 70% ISPRP (MISCELLANEOUS) ×2
BAG SPEC THK2 15X12 ZIP CLS (MISCELLANEOUS) ×1
BAG ZIPLOCK 12X15 (MISCELLANEOUS) ×3 IMPLANT
BASEPLATE P2 COATD GLND 6.5X30 (Shoulder) IMPLANT
BIT DRILL 1.6MX128 (BIT) ×2 IMPLANT
BIT DRILL 1.6MX128MM (BIT) ×1
BIT DRILL 2.5 DIA 127 CALI (BIT) ×2 IMPLANT
BIT DRILL 4 DIA CALIBRATED (BIT) ×2 IMPLANT
BLADE SAW SAG 73X25 THK (BLADE) ×2
BLADE SAW SGTL 18X1.27X75 (BLADE) IMPLANT
BLADE SAW SGTL 18X1.27X75MM (BLADE)
BLADE SAW SGTL 73X25 THK (BLADE) ×1 IMPLANT
BSPLAT GLND 30 STRL LF SHLDR (Shoulder) ×1 IMPLANT
CHLORAPREP W/TINT 26 (MISCELLANEOUS) ×6 IMPLANT
CLOSURE WOUND 1/2 X4 (GAUZE/BANDAGES/DRESSINGS) ×1
COOLER ICEMAN CLASSIC (MISCELLANEOUS) ×2 IMPLANT
COVER BACK TABLE 60X90IN (DRAPES) ×3 IMPLANT
COVER SURGICAL LIGHT HANDLE (MISCELLANEOUS) ×3 IMPLANT
COVER WAND RF STERILE (DRAPES) IMPLANT
DRAPE INCISE IOBAN 66X45 STRL (DRAPES) ×3 IMPLANT
DRAPE ORTHO SPLIT 77X108 STRL (DRAPES) ×6
DRAPE POUCH INSTRU U-SHP 10X18 (DRAPES) ×3 IMPLANT
DRAPE SHEET LG 3/4 BI-LAMINATE (DRAPES) ×6 IMPLANT
DRAPE SURG 17X11 SM STRL (DRAPES) ×3 IMPLANT
DRAPE SURG ORHT 6 SPLT 77X108 (DRAPES) ×2 IMPLANT
DRAPE U-SHAPE 47X51 STRL (DRAPES) ×3 IMPLANT
DRSG AQUACEL AG ADV 3.5X 6 (GAUZE/BANDAGES/DRESSINGS) ×3 IMPLANT
ELECT BLADE TIP CTD 4 INCH (ELECTRODE) ×3 IMPLANT
ELECT REM PT RETURN 15FT ADLT (MISCELLANEOUS) ×3 IMPLANT
GLOVE BIO SURGEON STRL SZ7 (GLOVE) ×3 IMPLANT
GLOVE BIO SURGEON STRL SZ7.5 (GLOVE) ×3 IMPLANT
GLOVE BIOGEL PI IND STRL 7.0 (GLOVE) ×1 IMPLANT
GLOVE BIOGEL PI IND STRL 8 (GLOVE) ×1 IMPLANT
GLOVE BIOGEL PI INDICATOR 7.0 (GLOVE) ×2
GLOVE BIOGEL PI INDICATOR 8 (GLOVE) ×2
GOWN STRL REUS W/TWL LRG LVL3 (GOWN DISPOSABLE) ×6 IMPLANT
GOWN STRL REUS W/TWL XL LVL3 (GOWN DISPOSABLE) ×3 IMPLANT
HANDPIECE INTERPULSE COAX TIP (DISPOSABLE) ×3
HEMOSTAT SURGICEL 2X14 (HEMOSTASIS) IMPLANT
HOOD PEEL AWAY FLYTE STAYCOOL (MISCELLANEOUS) ×9 IMPLANT
INSERT EPOLY STND HUMERUS 32MM (Shoulder) ×3 IMPLANT
INSERT EPOLYSTD HUMERUS 32MM (Shoulder) IMPLANT
KIT BASIN OR (CUSTOM PROCEDURE TRAY) ×3 IMPLANT
KIT TURNOVER KIT A (KITS) IMPLANT
MANIFOLD NEPTUNE II (INSTRUMENTS) ×3 IMPLANT
NDL TROCAR POINT SZ 2 1/2 (NEEDLE) ×1 IMPLANT
NEEDLE TROCAR POINT SZ 2 1/2 (NEEDLE) ×3 IMPLANT
NS IRRIG 1000ML POUR BTL (IV SOLUTION) ×3 IMPLANT
P2 COATDE GLNOID BSEPLT 6.5X30 (Shoulder) ×3 IMPLANT
PACK SHOULDER (CUSTOM PROCEDURE TRAY) ×3 IMPLANT
PAD COLD SHLDR WRAP-ON (PAD) ×2 IMPLANT
PROTECTOR NERVE ULNAR (MISCELLANEOUS) ×3 IMPLANT
RESTRAINT HEAD UNIVERSAL NS (MISCELLANEOUS) ×3 IMPLANT
RETRIEVER SUT HEWSON (MISCELLANEOUS) ×3 IMPLANT
SCREW BONE LOCKING RSP 5.0X14 (Screw) ×3 IMPLANT
SCREW BONE LOCKING RSP 5.0X30 (Screw) ×6 IMPLANT
SCREW BONE RSP LOCK 5X14 (Screw) IMPLANT
SCREW BONE RSP LOCK 5X18 (Screw) IMPLANT
SCREW BONE RSP LOCK 5X30 (Screw) IMPLANT
SCREW BONE RSP LOCKING 18MM LG (Screw) ×3 IMPLANT
SCREW RETAIN W/HEAD 32MM (Shoulder) ×2 IMPLANT
SET HNDPC FAN SPRY TIP SCT (DISPOSABLE) ×1 IMPLANT
SLING ARM FOAM STRAP LRG (SOFTGOODS) ×2 IMPLANT
SLING ARM IMMOBILIZER LRG (SOFTGOODS) ×3 IMPLANT
SMARTMIX MINI TOWER (MISCELLANEOUS) ×3
SPONGE LAP 18X18 RF (DISPOSABLE) ×3 IMPLANT
STEM HUMERAL 12X48 STD SHORT (Shoulder) ×2 IMPLANT
STRIP CLOSURE SKIN 1/2X4 (GAUZE/BANDAGES/DRESSINGS) ×2 IMPLANT
SUCTION FRAZIER HANDLE 12FR (TUBING) ×3
SUCTION TUBE FRAZIER 12FR DISP (TUBING) ×1 IMPLANT
SUPPORT WRAP ARM LG (MISCELLANEOUS) ×3 IMPLANT
SUT ETHIBOND 2 V 37 (SUTURE) ×3 IMPLANT
SUT MNCRL AB 4-0 PS2 18 (SUTURE) ×3 IMPLANT
SUT VIC AB 2-0 CT1 27 (SUTURE) ×6
SUT VIC AB 2-0 CT1 TAPERPNT 27 (SUTURE) ×1 IMPLANT
TAPE LABRALWHITE 1.5X36 (TAPE) IMPLANT
TAPE SUT LABRALTAP WHT/BLK (SUTURE) IMPLANT
TOWEL OR 17X26 10 PK STRL BLUE (TOWEL DISPOSABLE) ×3 IMPLANT
TOWER SMARTMIX MINI (MISCELLANEOUS) ×1 IMPLANT
WATER STERILE IRR 1000ML POUR (IV SOLUTION) ×3 IMPLANT
YANKAUER SUCT BULB TIP 10FT TU (MISCELLANEOUS) ×3 IMPLANT

## 2020-02-09 NOTE — Op Note (Signed)
Procedure(s): REVERSE TOTAL SHOULDER ARTHROPLASTY Procedure Note  Melvin Hamilton male 74 y.o. 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 male  With endstage right shoulder arthritis with previous rotator cuff surgery and current pathology. Pain and dysfunction interfered with quality of life and nonoperative treatment with activity modification, NSAIDS and injections failed.     Surgeon: Berline Lopes   Assistants: Damita Lack PA-C [facility] was present and scrubbed throughout the procedure and was essential in positioning, retraction, exposure, and closure)  Anesthesia: General endotracheal anesthesia with preoperative interscalene block given by the attending anesthesiologist    Procedure Detail  REVERSE TOTAL SHOULDER ARTHROPLASTY   Estimated Blood Loss:  200 mL         Drains: none  Blood Given: none          Specimens: none        Complications:  * No complications entered in OR log *         Disposition: PACU - hemodynamically stable.         Condition: stable      OPERATIVE FINDINGS:  A DJO Altivate pressfit reverse total shoulder arthroplasty was placed with a  size 12 stem, a 32 neutral glenosphere, and a standard poly insert. The base plate  fixation was excellent.  PROCEDURE: The patient was identified in the preoperative holding area  where I personally marked the operative site after verifying site, side,  and procedure with the patient. An interscalene block given by  the attending anesthesiologist in the holding area and the patient was taken back to the operating room where all extremities were  carefully padded in position after general anesthesia was induced. She  was placed in a beach-chair position and the operative upper  extremity was  prepped and draped in a standard sterile fashion. An approximately 10-  cm incision was made from the tip of the coracoid process to the center  point of the humerus at the level of the axilla. Dissection was carried  down through subcutaneous tissues to the level of the cephalic vein  which was taken laterally with the deltoid. The pectoralis major was  retracted medially. The subdeltoid space was developed and the lateral  edge of the conjoined tendon was identified. The undersurface of  conjoined tendon was palpated and the musculocutaneous nerve was not in  the field. Retractor was placed underneath the conjoined and second  retractor was placed lateral into the deltoid. The circumflex humeral  artery and vessels were identified and clamped and coagulated. The  biceps tendon was tenodesed to the upper border the pectoralis major.  The subscapularis was taken down as a peel with the underlying capsule.  The  joint was then gently externally rotated while the capsule was released  from the humeral neck around to just beyond the 6 o'clock position. At  this point, the joint was dislocated and the humeral head was presented  into the wound. The excessive osteophyte formation was removed with a  large rongeur.  The cutting guide was used to make the appropriate  head cut and the head was saved for potentially bone grafting.  The glenoid was exposed with the arm in an  abducted extended position. The anterior and posterior labrum were  completely excised and the capsule was released circumferentially to  allow for exposure of the glenoid for preparation. The 2.5 mm drill was  placed using the guide in 5-10 inferior angulation and the tap was then advanced in the same hole. Small and large reamers were then used. The tap was then removed and the Metaglene was then screwed in with excellent purchase.  The peripheral guide was then used to drilled measured and filled peripheral  locking screws. The size 32 neutral glenosphere was then impacted on the [facility] taper and the central screw was placed. The humerus was then again exposed and the diaphyseal reamers were used followed by the metaphyseal reamers. The final broach was left in place in the proximal trial was placed. The joint was reduced and with this implant it was felt that soft tissue tensioning was appropriate with excellent stability and excellent range of motion. Therefore, final humeral stem was placed press-fit.  And then the trial polyethylene inserts were tested again and the above implant was felt to be the most appropriate for final insertion. The joint was reduced taken through full range of motion and felt to be stable. Soft tissue tension was appropriate.  The joint was then copiously irrigated with pulse  lavage and the wound was then closed. The subscapularis repaired with suture tapes passed through bone tunnels and around the implant prior to implantation.  These were used to repair the tendon in a horizontal mattress configuration..  Skin was closed with 2-0 Vicryl in a deep dermal layer and 4-0  Monocryl for skin closure. Steri-Strips were applied. Sterile  dressings were then applied as well as a sling. The patient was allowed  to awaken from general anesthesia, transferred to stretcher, and taken  to recovery room in stable condition.   POSTOPERATIVE PLAN: The patient will be observed in the recovery room and if he is doing well will be discharged home today with his family.

## 2020-02-09 NOTE — Anesthesia Preprocedure Evaluation (Addendum)
Anesthesia Evaluation  Patient identified by MRN, date of birth, ID band Patient awake    Reviewed: Allergy & Precautions, NPO status , Patient's Chart, lab work & pertinent test results  History of Anesthesia Complications Negative for: history of anesthetic complications  Airway Mallampati: III  TM Distance: >3 FB Neck ROM: Full    Dental  (+) Poor Dentition, Dental Advisory Given   Pulmonary sleep apnea ,    Pulmonary exam normal        Cardiovascular hypertension, Normal cardiovascular exam     Neuro/Psych negative neurological ROS     GI/Hepatic negative GI ROS, Neg liver ROS,   Endo/Other  negative endocrine ROS  Renal/GU negative Renal ROS     Musculoskeletal negative musculoskeletal ROS (+)   Abdominal   Peds  Hematology  (+) Blood dyscrasia, , hemochromatosis   Anesthesia Other Findings Day of surgery medications reviewed with the patient.  Reproductive/Obstetrics                            Anesthesia Physical Anesthesia Plan  ASA: II  Anesthesia Plan: General   Post-op Pain Management:  Regional for Post-op pain   Induction: Intravenous  PONV Risk Score and Plan: 3 and Ondansetron, Dexamethasone and Diphenhydramine  Airway Management Planned: Oral ETT  Additional Equipment:   Intra-op Plan:   Post-operative Plan: Extubation in OR  Informed Consent: I have reviewed the patients History and Physical, chart, labs and discussed the procedure including the risks, benefits and alternatives for the proposed anesthesia with the patient or authorized representative who has indicated his/her understanding and acceptance.     Dental advisory given  Plan Discussed with: Anesthesiologist and CRNA  Anesthesia Plan Comments:        Anesthesia Quick Evaluation

## 2020-02-09 NOTE — Discharge Instructions (Signed)

## 2020-02-09 NOTE — H&P (Signed)
Melvin Hamilton is an 74 y.o. male.   Chief Complaint: R shoulder pain and dysfunction HPI: Endstage R shoulder arthritis with significant pain and dysfunction, failed conservative measures.  Pain interferes with sleep and quality of life.   Past Medical History:  Diagnosis Date  . Acne   . Arthritis    oa left knee  . 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 Dermatitis   . H/O sleep apnea   . Hypertension   . Sleep apnea     Past Surgical History:  Procedure Laterality Date  . BACK SURGERY     lumbar  . CARPAL TUNNEL RELEASE     right  . CATARACT EXTRACTION    . KNEE ARTHROSCOPY Left 12/22/2012   Procedure: ARTHROSCOPY KNEE WITH MEDIAL MENISCUS WITH DEBRIDEMENT;  Surgeon: Gearlean Alf, MD;  Location: [facility] ORS;  Service: Orthopedics;  Laterality: Left;  . PERCUTANEOUS LIVER BIOPSY  1993 or 1994  . ROTATOR CUFF REPAIR     x 2 right  . TONSILLECTOMY    . TOTAL KNEE ARTHROPLASTY Left 07/04/2013   Procedure: LEFT TOTAL KNEE ARTHROPLASTY;  Surgeon: Gearlean Alf, MD;  Location: [facility] ORS;  Service: Orthopedics;  Laterality: Left;    Family History  Problem Relation Age of Onset  . Heart attack Father   . Heart attack Brother   . Prostate cancer Brother        cancer spreading   Social History:  reports that he has never smoked. His smokeless tobacco use includes snuff. He reports current alcohol use. He reports that he does not use drugs.  Allergies:  Allergies  Allergen Reactions  . Prednisone Nausea And Vomiting  . Morphine And Related Itching and Rash    Medications Prior to Admission  Medication Sig Dispense Refill  . ampicillin (PRINCIPEN) 500 MG capsule Take 1,500 mg by mouth See admin instructions. Take 3 capsules (1500 mg) by mouth 1 hour prior to dental appointment/procedures.    . betamethasone valerate lotion (VALISONE) 0.1 % Apply 1 application topically  daily as needed (skin irritation.).     Marland Kitchen clindamycin (CLINDAGEL) 1 % gel Apply 1 application topically daily as needed (skin irritation (applied to face)).     . colchicine 0.6 MG tablet Take 0.6 mg by mouth daily as needed (gout flare ups).     . fluocinolone (SYNALAR) 0.01 % external solution Apply 1 application topically daily as needed (scalp irritation).     . triamcinolone cream (KENALOG) 0.1 % Apply 1 application topically daily as needed (skin irritation.).     Marland Kitchen Vitamin D, Ergocalciferol, (DRISDOL) 1.25 MG (50000 UNIT) CAPS capsule Take 50,000 Units by mouth every Monday.    . naproxen (NAPROSYN) 500 MG tablet Take 500 mg by mouth 2 (two) times daily as needed (pain (gout pain)).       No results found for this or any previous visit (from the past 48 hour(s)). No results found.  Review of Systems  All other systems reviewed and are negative.   Blood pressure (!) 149/97, pulse 60, temperature 98 F (36.7 C), temperature source Oral, resp. rate (!) 21, SpO2 100 %. Physical Exam  Constitutional: He is oriented to person, place, and time. He appears well-developed and well-nourished.  HENT:  Head: Atraumatic.  Eyes: EOM are normal.  Cardiovascular: Intact distal pulses.  Respiratory: Effort  normal.  Musculoskeletal:     Comments: R shoulder pain with limited ROM. NVID  Neurological: He is alert and oriented to person, place, and time.  Skin: Skin is warm and dry.  Psychiatric: He has a normal mood and affect.     Assessment/Plan R shoulder advanced arthritis with associated rotator cuff disease Plan R reverse TSA Risks / benefits of surgery discussed Consent on chart  NPO for OR Preop antibiotics   Berline Lopes, MD 02/09/2020, 11:48 AM

## 2020-02-09 NOTE — Transfer of Care (Signed)
Immediate Anesthesia Transfer of Care Note  Patient: Melvin Hamilton  Procedure(s) Performed: Procedure(s): REVERSE TOTAL SHOULDER ARTHROPLASTY (Right)  Patient Location: PACU  Anesthesia Type:General  Level of Consciousness:  sedated, patient cooperative and responds to stimulation  Airway & Oxygen Therapy:Patient Spontanous Breathing and Patient connected to face mask oxgen  Post-op Assessment:  Report given to PACU RN and Post -op Vital signs reviewed and stable  Post vital signs:  Reviewed and stable  Last Vitals:  Vitals:   02/09/20 1114 02/09/20 1115  BP:  (!) 149/97  Pulse: 62 60  Resp: 20 (!) 21  Temp:    SpO2: 100% 100%    Complications: No apparent anesthesia complications

## 2020-02-09 NOTE — Anesthesia Procedure Notes (Signed)
Procedure Name: Intubation Date/Time: 02/09/2020 12:49 PM Performed by: Lavina Hamman, CRNA Pre-anesthesia Checklist: Patient identified, Emergency Drugs available, Suction available, Patient being monitored and Timeout performed Patient Re-evaluated:Patient Re-evaluated prior to induction Oxygen Delivery Method: Circle system utilized Preoxygenation: Pre-oxygenation with 100% oxygen Induction Type: IV induction Ventilation: Mask ventilation without difficulty Laryngoscope Size: Mac and 4 Grade View: Grade II Tube type: Oral Tube size: 7.5 mm Number of attempts: 1 Airway Equipment and Method: Stylet Placement Confirmation: ETT inserted through vocal cords under direct vision,  positive ETCO2,  CO2 detector and breath sounds checked- equal and bilateral Secured at: 23 cm Tube secured with: Tape Dental Injury: Teeth and Oropharynx as per pre-operative assessment

## 2020-02-09 NOTE — Progress Notes (Signed)
Assisted Dr. Singer with right, ultrasound guided, interscalene  block. Side rails up, monitors on throughout procedure. See vital signs in flow sheet. Tolerated Procedure well. 

## 2020-02-09 NOTE — Anesthesia Procedure Notes (Signed)
Anesthesia Regional Block: Interscalene brachial plexus block   Pre-Anesthetic Checklist: ,, timeout performed, Correct Patient, Correct Site, Correct Laterality, Correct Procedure, Correct Position, site marked, Risks and benefits discussed,  Surgical consent,  Pre-op evaluation,  At surgeon's request and post-op pain management  Laterality: Right  Prep: chloraprep       Needles:  Injection technique: Single-shot  Needle Type: Echogenic Stimulator Needle     Needle Length: 5cm  Needle Gauge: 22     Additional Needles:   Narrative:  Start time: 02/09/2020 11:03 AM End time: 02/09/2020 11:13 AM Injection made incrementally with aspirations every 5 mL.  Performed by: Personally  Anesthesiologist: Heather Roberts, MD  Additional Notes: Functioning IV was confirmed and monitors applied.  A 81mm 22ga echogenic arrow stimulator was used. Sterile prep and drape,hand hygiene and sterile gloves were used.Ultrasound guidance: relevant anatomy identified, needle position confirmed, local anesthetic spread visualized around nerve(s)., vascular puncture avoided.  Image printed for medical record.  Negative aspiration and negative test dose prior to incremental administration of local anesthetic. The patient tolerated the procedure well.

## 2020-02-09 NOTE — Anesthesia Postprocedure Evaluation (Signed)
Anesthesia Post Note  Patient: Melvin Hamilton  Procedure(s) Performed: REVERSE TOTAL SHOULDER ARTHROPLASTY (Right Shoulder)     Patient location during evaluation: PACU Anesthesia Type: General Level of consciousness: sedated and patient cooperative Pain management: pain level controlled Vital Signs Assessment: post-procedure vital signs reviewed and stable Respiratory status: spontaneous breathing Cardiovascular status: stable Anesthetic complications: no    Last Vitals:  Vitals:   02/09/20 1500 02/09/20 1515  BP: 122/83 135/66  Pulse: 63 68  Resp: 16 20  Temp:  36.9 C  SpO2: 92% 94%    Last Pain:  Vitals:   02/09/20 1515  TempSrc:   PainSc: 0-No pain                 Lewie Loron

## 2020-02-10 ENCOUNTER — Encounter: Payer: Self-pay | Admitting: *Deleted

## 2020-05-17 IMAGING — CR DG CHEST 2V
2 series · 2 of 2 positions shown · non-contrast
Comparison: PA and lateral chest 12/02/2017.

CLINICAL DATA: Preoperative examination. Patient for shoulder
arthroplasty.

EXAM:
CHEST - 2 VIEW

[w chest pa]
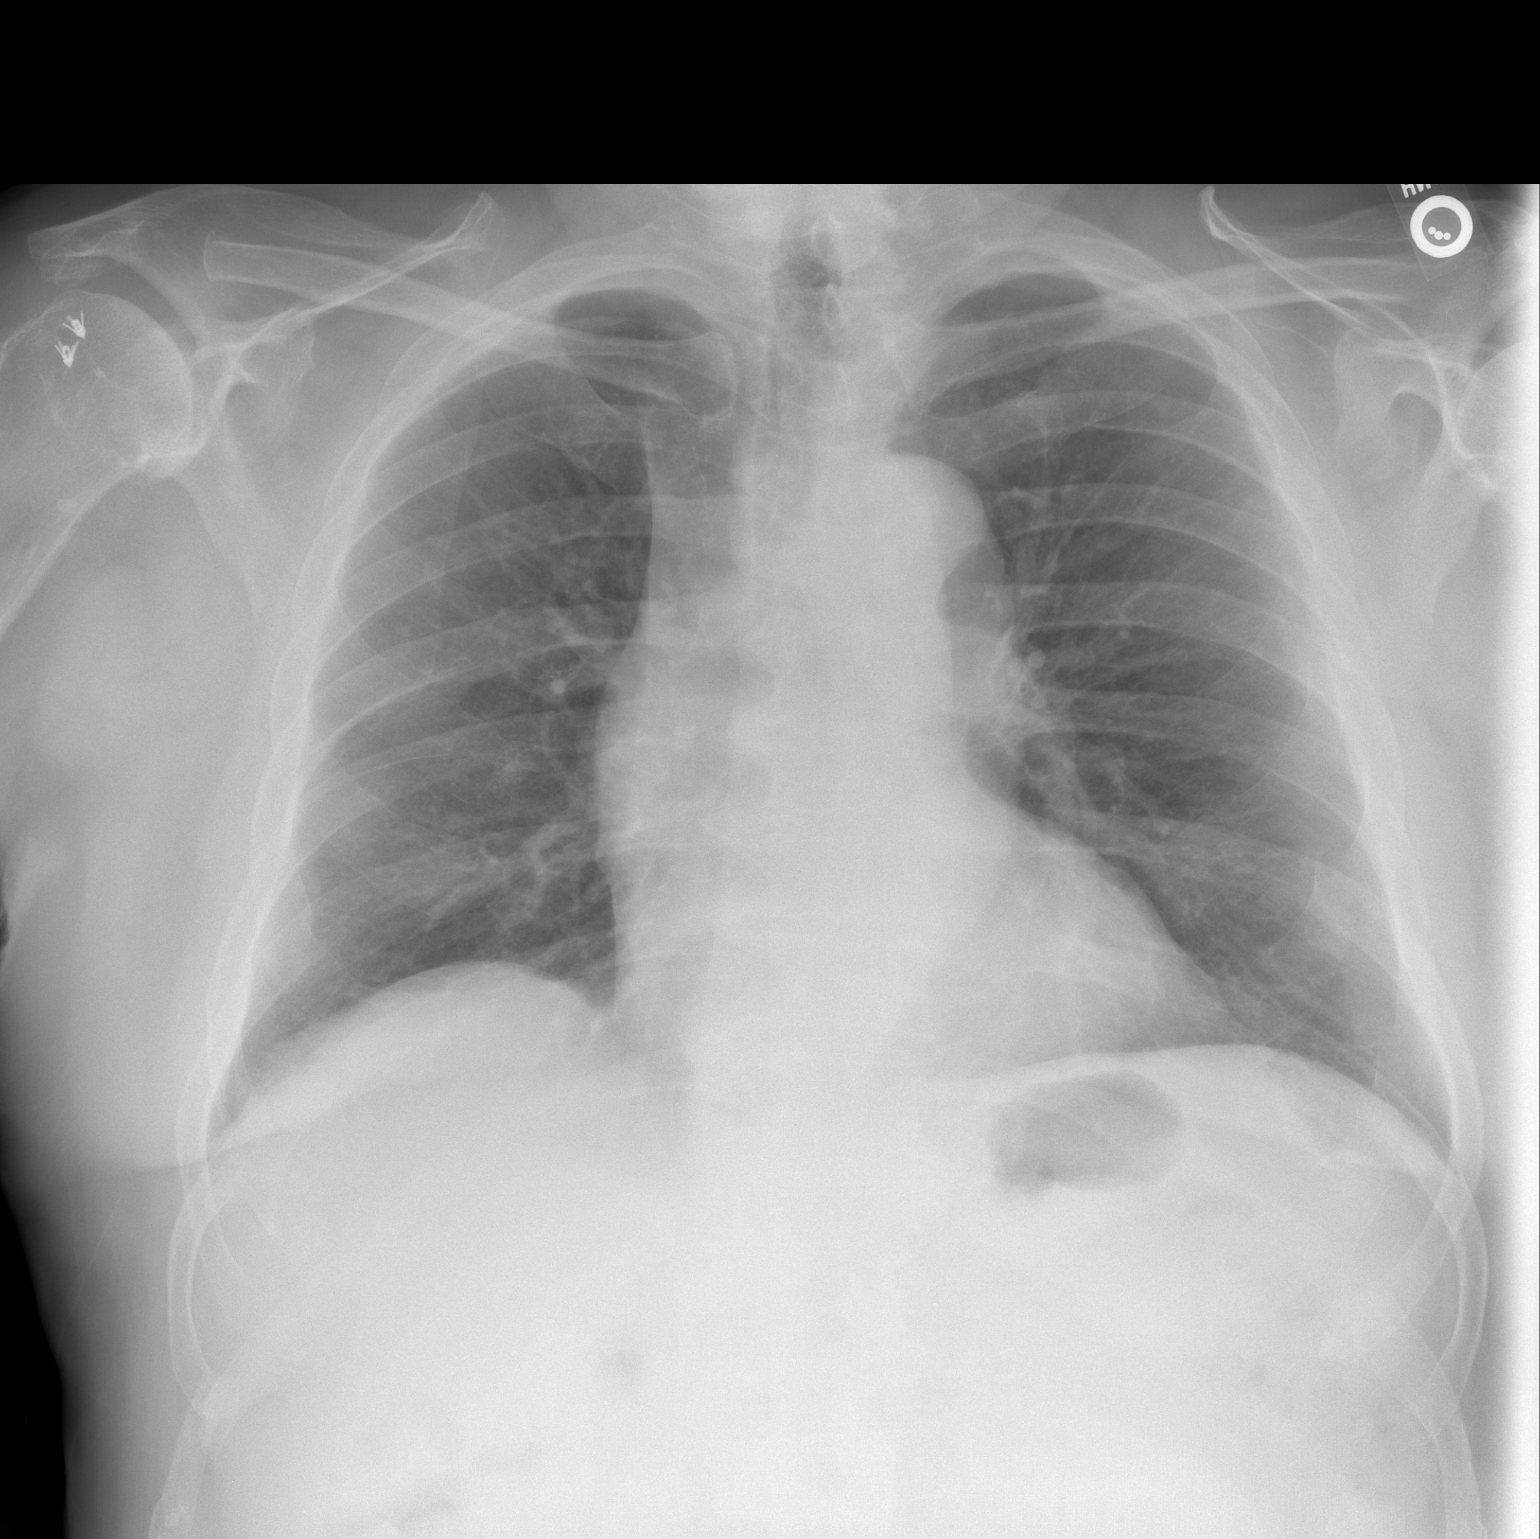

[w chest lat]
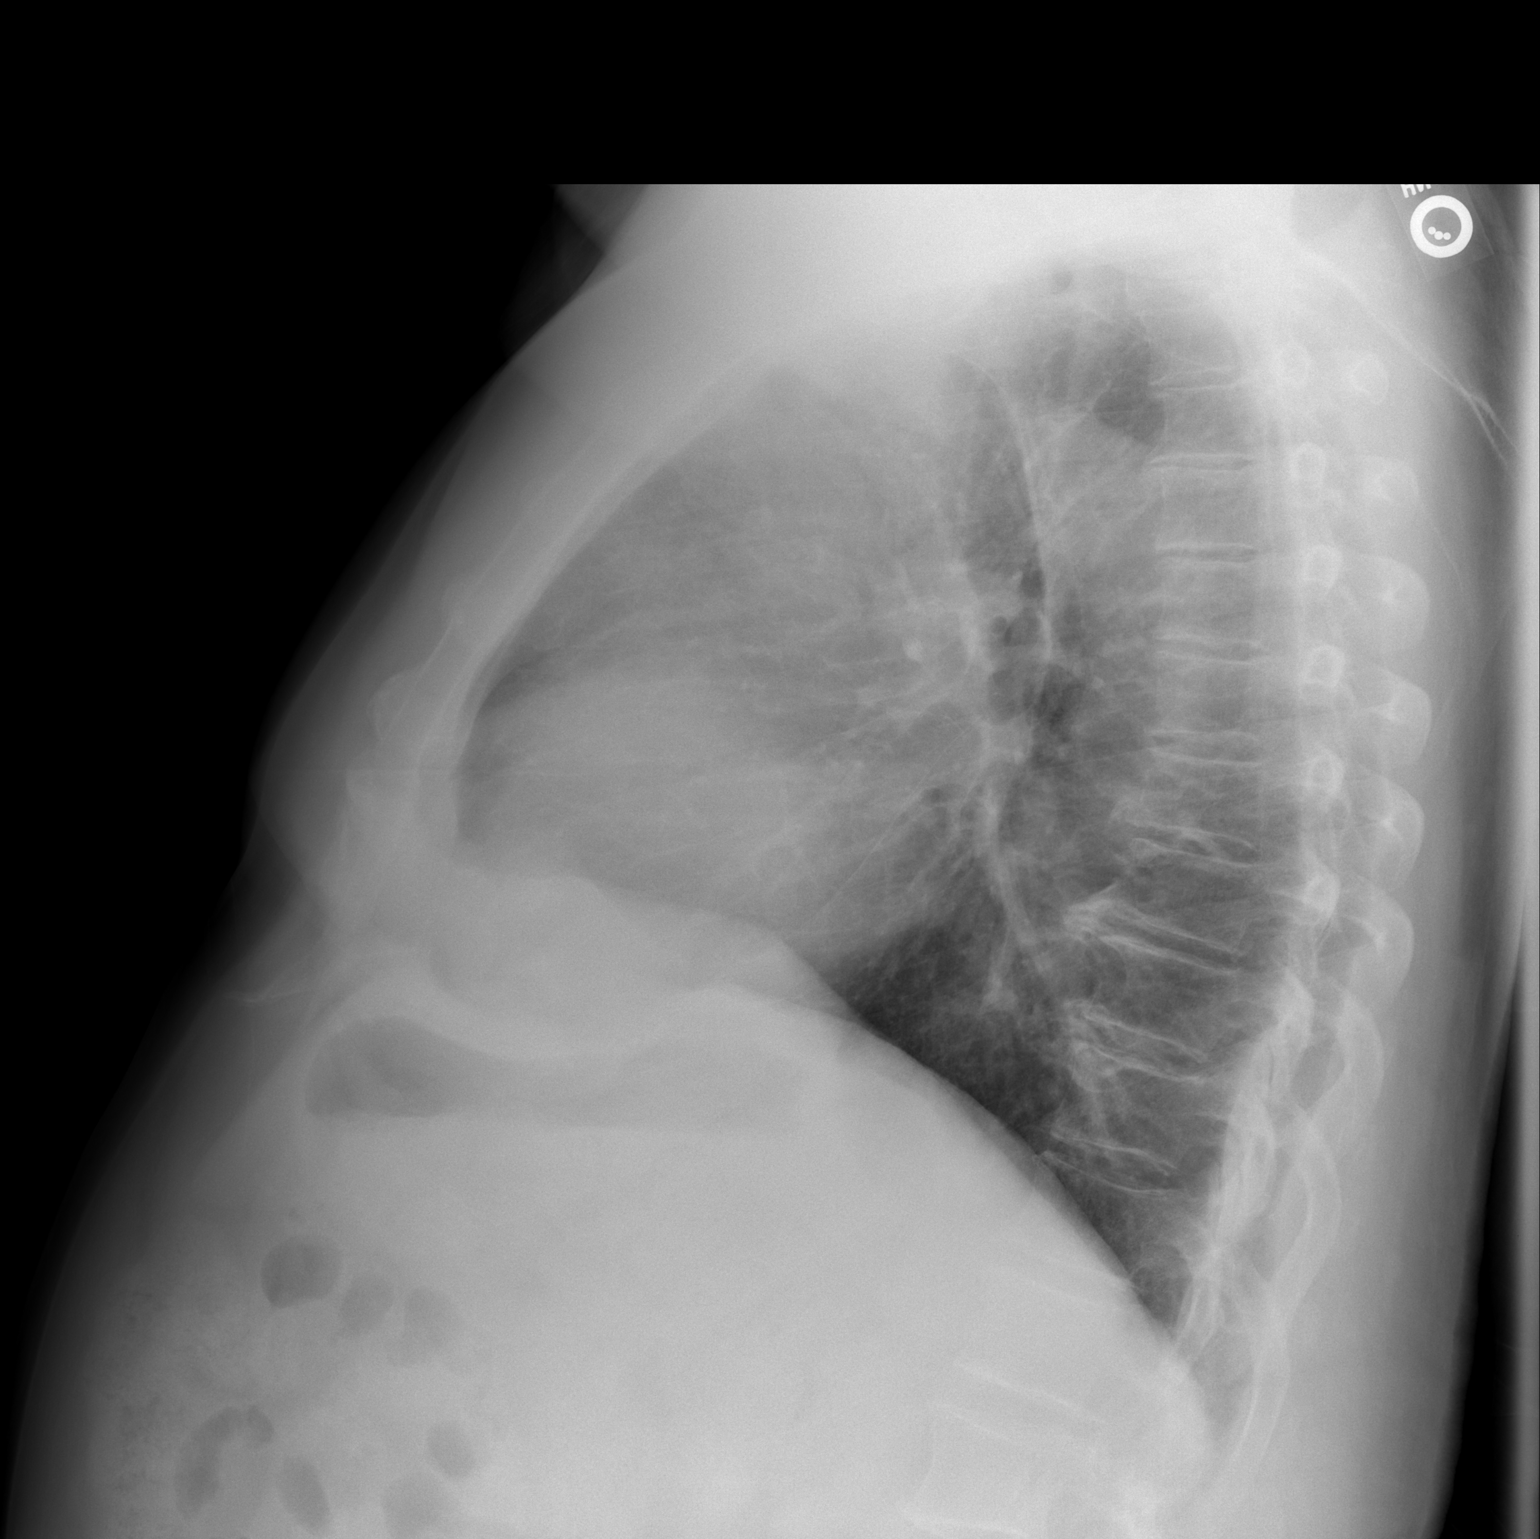

[2 of 2 positions shown; findings below may reference images not displayed]

FINDINGS: The lungs are clear. Heart size is normal. No pneumothorax or
pleural fluid. No acute or focal bony abnormality.
IMPRESSION: No active cardiopulmonary disease.

## 2020-05-25 IMAGING — DX DG SHOULDER 2+V PORT*R*
1 series · 1 of 1 positions shown · non-contrast
Comparison: Chest radiograph 02/01/2020

CLINICAL DATA: Post reversed total right shoulder arthroplasty

EXAM:
PORTABLE RIGHT SHOULDER

[shoulder ap]
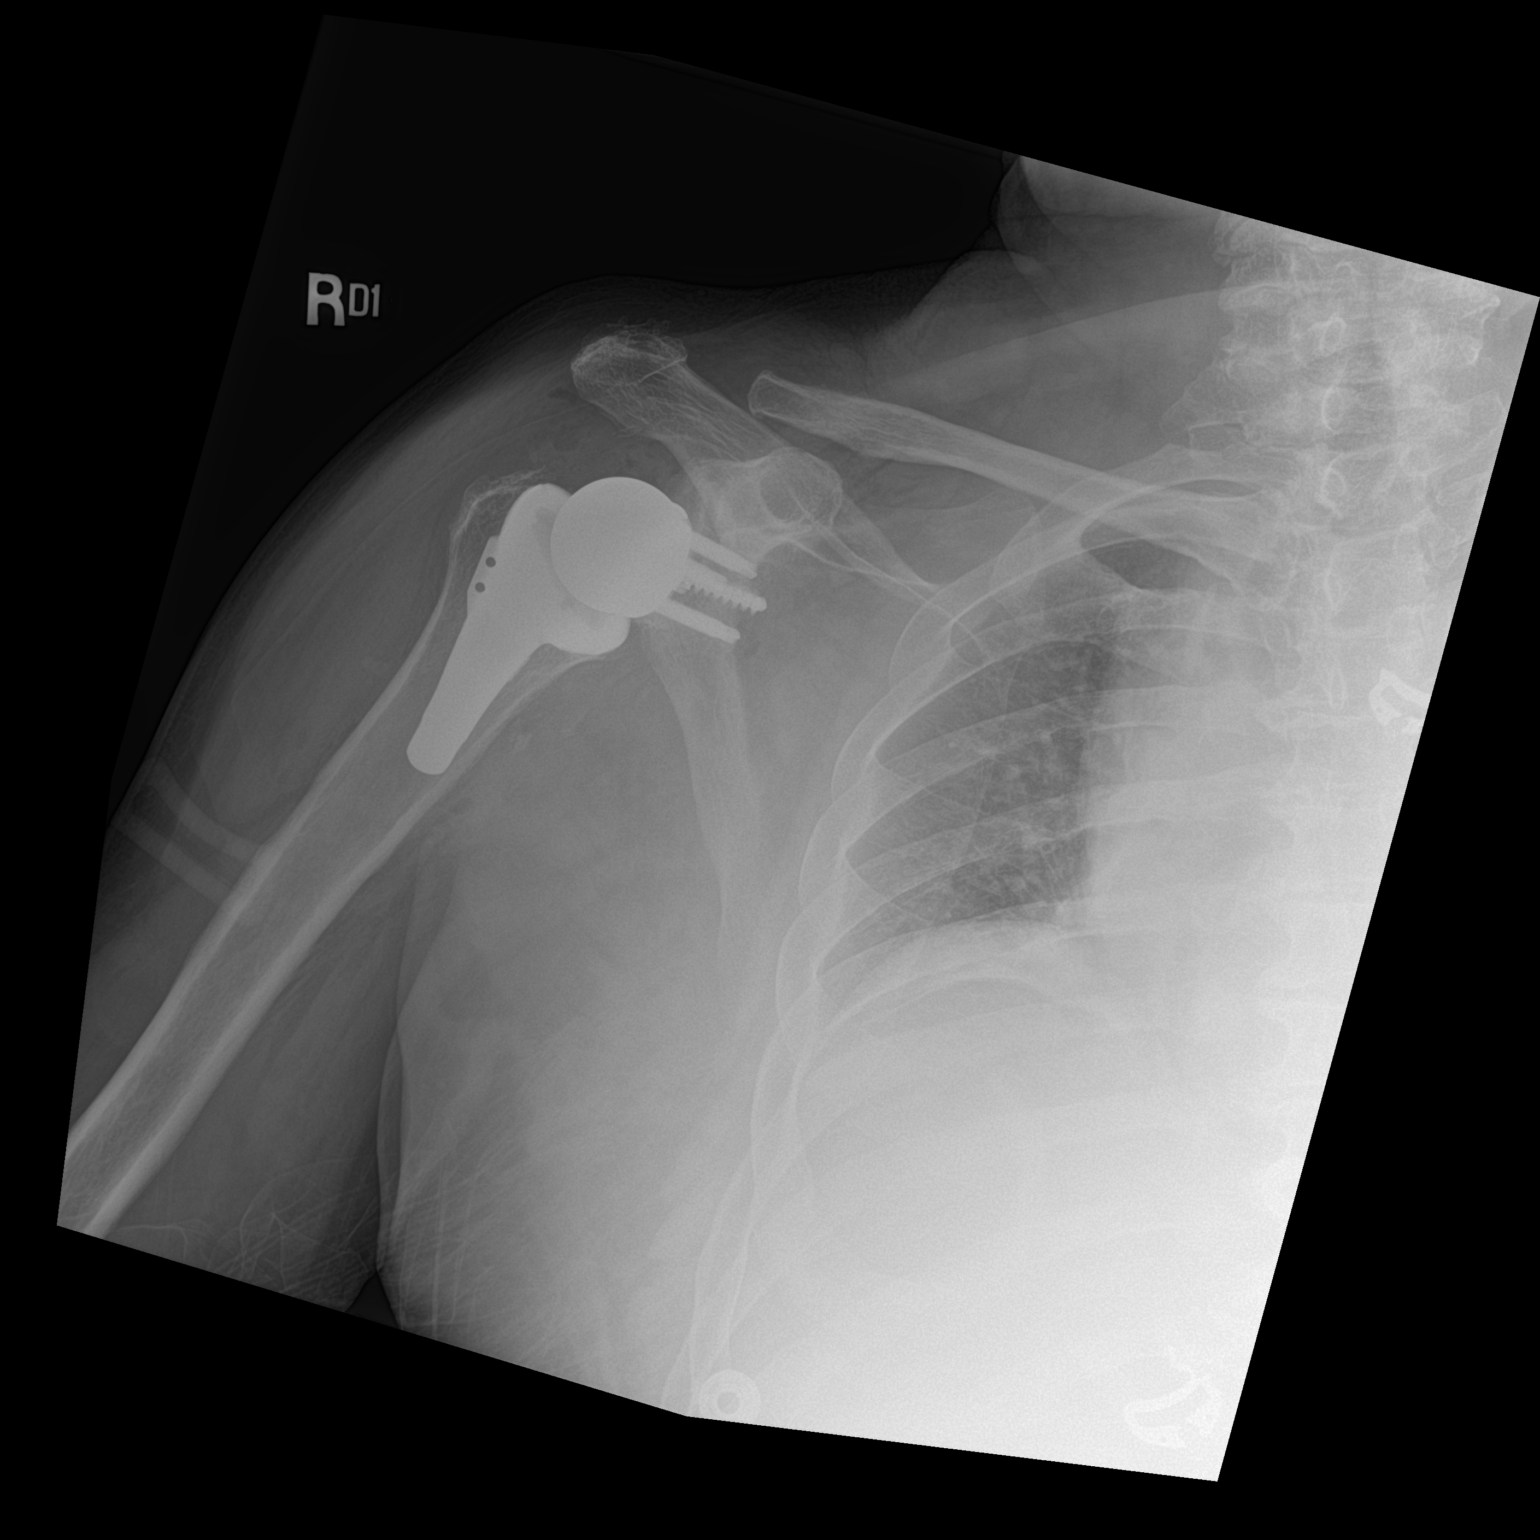

[1 of 1 positions shown; findings below may reference images not displayed]

FINDINGS: Patient in demonstrates expected postsurgical changes following a
reverse right shoulder arthroplasty which appears to be in expected
alignment. Overlying postsurgical soft tissue changes including soft
tissue gas and intra-articular gas are noted. No acute complication
is evident. Remote distal right clavicular resection is noted as
well.
IMPRESSION: Expected postsurgical changes following reverse right shoulder
arthroplasty.

## 2020-06-18 ENCOUNTER — Other Ambulatory Visit: Payer: Self-pay | Admitting: Physician Assistant

## 2020-06-18 DIAGNOSIS — U071 COVID-19: Secondary | ICD-10-CM

## 2020-06-18 NOTE — Progress Notes (Signed)
I connected by phone with Melvin Hamilton on 06/18/2020 at 11:34 AM to discuss the potential use of a new treatment for mild to moderate COVID-19 viral infection in non-hospitalized patients.  This patient is a 74 y.o. male that meets the FDA criteria for Emergency Use Authorization of COVID monoclonal antibody casirivimab/imdevimab.  Has a (+) direct SARS-CoV-2 viral test result  Has mild or moderate COVID-19   Is NOT hospitalized due to COVID-19  Is within 10 days of symptom onset  Has at least one of the high risk factor(s) for progression to severe COVID-19 and/or hospitalization as defined in EUA.  Specific high risk criteria : Older age (>/= 76 yo)   I have spoken and communicated the following to the patient or parent/caregiver regarding COVID monoclonal antibody treatment:  1. FDA has authorized the emergency use for the treatment of mild to moderate COVID-19 in adults and pediatric patients with positive results of direct SARS-CoV-2 viral testing who are 79 years of age and older weighing at least 40 kg, and who are at high risk for progressing to severe COVID-19 and/or hospitalization.  2. The significant known and potential risks and benefits of COVID monoclonal antibody, and the extent to which such potential risks and benefits are unknown.  3. Information on available alternative treatments and the risks and benefits of those alternatives, including clinical trials.  4. Patients treated with COVID monoclonal antibody should continue to self-isolate and use infection control measures (e.g., wear mask, isolate, social distance, avoid sharing personal items, clean and disinfect "high touch" surfaces, and frequent handwashing) according to CDC guidelines.   5. The patient or parent/caregiver has the option to accept or refuse COVID monoclonal antibody treatment.  After reviewing this information with the patient, The patient agreed to proceed with receiving casirivimab\imdevimab  infusion and will be provided a copy of the Fact sheet prior to receiving the infusion.   Melvin Hamilton 06/18/2020 11:34 AM

## 2020-06-19 ENCOUNTER — Ambulatory Visit (HOSPITAL_COMMUNITY)
Admission: RE | Admit: 2020-06-19 | Discharge: 2020-06-19 | Disposition: A | Discharge status: Home / Self Care | Payer: Medicare Other | Source: Ambulatory Visit | Attending: Pulmonary Disease | Admitting: Pulmonary Disease

## 2020-06-19 DIAGNOSIS — R54 Age-related physical debility: Secondary | ICD-10-CM | POA: Insufficient documentation

## 2020-06-19 DIAGNOSIS — Z23 Encounter for immunization: Secondary | ICD-10-CM | POA: Diagnosis not present

## 2020-06-19 DIAGNOSIS — U071 COVID-19: Secondary | ICD-10-CM | POA: Diagnosis present

## 2020-06-19 MED ORDER — EPINEPHRINE 0.3 MG/0.3ML IJ SOAJ: 0.3000 mg | Freq: Once | INTRAMUSCULAR | Status: DC | PRN

## 2020-06-19 MED ORDER — METHYLPREDNISOLONE SODIUM SUCC 125 MG IJ SOLR: 125.0000 mg | Freq: Once | INTRAMUSCULAR | Status: DC | PRN

## 2020-06-19 MED ORDER — SODIUM CHLORIDE 0.9 % IV SOLN
1200.0000 mg | Freq: Once | INTRAVENOUS | Status: AC
  Administered 2020-06-19: 1200 mg via INTRAVENOUS
  Filled 2020-06-19: qty 10

## 2020-06-19 MED ORDER — DIPHENHYDRAMINE HCL 50 MG/ML IJ SOLN: 50.0000 mg | Freq: Once | INTRAMUSCULAR | Status: DC | PRN

## 2020-06-19 MED ORDER — FAMOTIDINE IN NACL 20-0.9 MG/50ML-% IV SOLN: 20.0000 mg | Freq: Once | INTRAVENOUS | Status: DC | PRN

## 2020-06-19 MED ORDER — ALBUTEROL SULFATE HFA 108 (90 BASE) MCG/ACT IN AERS: 2.0000 | INHALATION_SPRAY | Freq: Once | RESPIRATORY_TRACT | Status: DC | PRN

## 2020-06-19 MED ORDER — SODIUM CHLORIDE 0.9 % IV SOLN: INTRAVENOUS | Status: DC | PRN

## 2020-06-19 NOTE — Discharge Instructions (Signed)

## 2020-06-19 NOTE — Progress Notes (Signed)
  Diagnosis: COVID-19  Physician: Dr. Wright  Procedure: [facility] Med: casirivimab\imdevimab infusion - Provided patient with casirivimab\imdevimab fact sheet for patients, parents and caregivers prior to infusion.  Complications: No immediate complications noted.  Discharge: Discharged home   Jarmaine Ehrler R Jo Booze 06/19/2020   

## 2020-06-25 ENCOUNTER — Encounter: Payer: Self-pay | Admitting: Critical Care Medicine

## 2020-07-31 ENCOUNTER — Encounter (INDEPENDENT_AMBULATORY_CARE_PROVIDER_SITE_OTHER): Payer: Self-pay | Admitting: Otolaryngology

## 2020-07-31 ENCOUNTER — Other Ambulatory Visit: Payer: Self-pay

## 2020-07-31 ENCOUNTER — Ambulatory Visit (INDEPENDENT_AMBULATORY_CARE_PROVIDER_SITE_OTHER): Payer: Medicare Other | Admitting: Otolaryngology

## 2020-07-31 VITALS — Temp 97.9°F

## 2020-07-31 DIAGNOSIS — J310 Chronic rhinitis: Secondary | ICD-10-CM | POA: Diagnosis not present

## 2020-07-31 DIAGNOSIS — H6123 Impacted cerumen, bilateral: Secondary | ICD-10-CM

## 2020-07-31 NOTE — Progress Notes (Signed)
HPI: Melvin Hamilton is a 74 y.o. male who returns today for evaluation of sinuses and ears.  He has blockage of his hearing mostly on the right side.  He has had history of wax problems in the past last cleaned in January 2020.  He is also had complaints of mostly clear mucus discharge from his nose that comes and goes.  Does not describe mucopurulent discharge from his nose.  He has had occasional pain in the right cheek area and had x-rays by his dentist who thought this was more related to the sinus than the dentition.Marland Kitchen  He did have a CT scan of his sinuses performed in 2015 that showed mild sinus disease.  Past Medical History:  Diagnosis Date  . Acne   . Arthritis    oa left knee  . 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 Dermatitis   . H/O sleep apnea   . Hypertension   . Sleep apnea    Past Surgical History:  Procedure Laterality Date  . BACK SURGERY     lumbar  . CARPAL TUNNEL RELEASE     right  . CATARACT EXTRACTION    . KNEE ARTHROSCOPY Left 12/22/2012   Procedure: ARTHROSCOPY KNEE WITH MEDIAL MENISCUS WITH DEBRIDEMENT;  Surgeon: Loanne Drilling, MD;  Location: [facility];  Service: Orthopedics;  Laterality: Left;  . PERCUTANEOUS LIVER BIOPSY  1993 or 1994  . ROTATOR CUFF REPAIR     x 2 right  . TONSILLECTOMY    . TOTAL KNEE ARTHROPLASTY Left 07/04/2013   Procedure: LEFT TOTAL KNEE ARTHROPLASTY;  Surgeon: Loanne Drilling, MD;  Location: [facility];  Service: Orthopedics;  Laterality: Left;  . TOTAL SHOULDER ARTHROPLASTY Right 02/09/2020   Procedure: REVERSE TOTAL SHOULDER ARTHROPLASTY;  Surgeon: Jones Broom, MD;  Location: [facility];  Service: Orthopedics;  Laterality: Right;   Social History   Socioeconomic History  . Marital status: Married    Spouse name: Not on file  . Number of children: 2  . Years of education: Not on file  . Highest education level: Not on file   Occupational History  . Occupation: retired  Tobacco Use  . Smoking status: Never Smoker  . Smokeless tobacco: Current User    Types: Snuff  Vaping Use  . Vaping Use: Never used  Substance and Sexual Activity  . Alcohol use: Yes    Comment: 2-3 times weekly liqour and beer  . Drug use: No  . Sexual activity: Not on file  Other Topics Concern  . Not on file  Social History Narrative  . Not on file   Social Determinants of Health   Financial Resource Strain:   . Difficulty of Paying Living Expenses: Not on file  Food Insecurity:   . Worried About Programme researcher, broadcasting/film/video in the Last Year: Not on file  . Ran Out of Food in the Last Year: Not on file  Transportation Needs:   . Lack of Transportation (Medical): Not on file  . Lack of Transportation (Non-Medical): Not on file  Physical Activity:   . Days of Exercise per Week: Not on file  . Minutes of Exercise per Session: Not on file  Stress:   . Feeling of Stress : Not on file  Social Connections:   . Frequency of Communication with Friends and Family: Not on file  .  Frequency of Social Gatherings with Friends and Family: Not on file  . Attends Religious Services: Not on file  . Active Member of Clubs or Organizations: Not on file  . Attends Banker Meetings: Not on file  . Marital Status: Not on file   Family History  Problem Relation Age of Onset  . Heart attack Father   . Heart attack Brother   . Prostate cancer Brother        cancer spreading   Allergies  Allergen Reactions  . Prednisone Nausea And Vomiting  . Morphine And Related Itching and Rash   Prior to Admission medications   Medication Sig Start Date End Date Taking? Authorizing Provider  ampicillin (PRINCIPEN) 500 MG capsule Take 1,500 mg by mouth See admin instructions. Take 3 capsules (1500 mg) by mouth 1 hour prior to dental appointment/procedures.   Yes [provider]  betamethasone valerate lotion (VALISONE) 0.1 % Apply 1  application topically daily as needed (skin irritation.).  08/01/19  Yes [provider]  clindamycin (CLINDAGEL) 1 % gel Apply 1 application topically daily as needed (skin irritation (applied to face)).  01/19/20  Yes [provider]  colchicine 0.6 MG tablet Take 0.6 mg by mouth daily as needed (gout flare ups).  12/28/19  Yes [provider]  fluocinolone (SYNALAR) 0.01 % external solution Apply 1 application topically daily as needed (scalp irritation).  01/19/20  Yes [provider]  oxyCODONE-acetaminophen (PERCOCET) 5-325 MG tablet Take 1-2 tablets every 4 hours as needed for post operative pain. MAX 6/day 02/09/20  Yes Jiles Harold, PA-C  tiZANidine (ZANAFLEX) 4 MG tablet Take 1 tablet (4 mg total) by mouth every 8 (eight) hours as needed for muscle spasms. 02/09/20  Yes Jiles Harold, PA-C  triamcinolone cream (KENALOG) 0.1 % Apply 1 application topically daily as needed (skin irritation.).  10/13/19  Yes [provider]  Vitamin D, Ergocalciferol, (DRISDOL) 1.25 MG (50000 UNIT) CAPS capsule Take 50,000 Units by mouth every Monday. 01/20/20  Yes [provider]     Positive ROS: Otherwise negative  All other systems have been reviewed and were otherwise negative with the exception of those mentioned in the HPI and as above.  Physical Exam: Constitutional: Alert, well-appearing, no acute distress Ears: External ears without lesions or tenderness.  He had large amount of wax buildup in both ear canals that was cleaned with forceps.  TMs were otherwise clear and hearing was improved. Nasal: External nose without lesions. Septum deviated slightly to the right.  Clear mucus discharge within the nose with mild rhinitis.  Both middle meatus regions were clear with no evidence of mucopurulent discharge.  No polyps noted..  Oral: Lips and gums without lesions. Tongue and palate mucosa without lesions. Posterior oropharynx clear. Neck: No  palpable adenopathy or masses Respiratory: Breathing comfortably  Skin: No facial/neck lesions or rash noted.  Cerumen impaction removal  Date/Time: 07/31/2020 4:26 PM Performed by: Drema Halon, MD Authorized by: Drema Halon, MD   Consent:    Consent obtained:  Verbal   Consent given by:  Patient   Risks discussed:  Pain and bleeding Procedure details:    Location:  L ear and R ear   Procedure type: suction and forceps   Post-procedure details:    Inspection:  TM intact and canal normal   Hearing quality:  Improved   Patient tolerance of procedure:  Tolerated well, no immediate complications Comments:     TMs are clear bilaterally.  Assessment: Chronic rhinitis with history of sinus infections in the past but no present evidence of acute infection. Cerumen buildup in both ear canals right side worse than left.  Plan: Recommended regular use of the Flonase and nasal saline irrigation.  If he is having excessive amount of clear drainage which he has been having suggested trying Zyrtec Claritin or Allegra. Did give a prescription for Augmentin 875 mg twice daily for 10 days if he develops any pain pressure or yellow-green discharge from his nose.  He will follow-up as needed.  If he has chronic sinus issues may consider repeat CT scan of his sinuses. He will follow-up as needed to have his ears cleaned   Narda Bonds, MD

## 2022-11-19 ENCOUNTER — Emergency Department (HOSPITAL_BASED_OUTPATIENT_CLINIC_OR_DEPARTMENT_OTHER): Payer: Medicare Other

## 2022-11-19 ENCOUNTER — Other Ambulatory Visit (HOSPITAL_BASED_OUTPATIENT_CLINIC_OR_DEPARTMENT_OTHER): Payer: Self-pay

## 2022-11-19 ENCOUNTER — Emergency Department (HOSPITAL_BASED_OUTPATIENT_CLINIC_OR_DEPARTMENT_OTHER)
Admission: EM | Admit: 2022-11-19 | Discharge: 2022-11-19 | Disposition: A | Discharge status: Home / Self Care | Payer: Medicare Other | Attending: Emergency Medicine | Admitting: Emergency Medicine

## 2022-11-19 ENCOUNTER — Encounter (HOSPITAL_BASED_OUTPATIENT_CLINIC_OR_DEPARTMENT_OTHER): Payer: Self-pay

## 2022-11-19 DIAGNOSIS — R059 Cough, unspecified: Secondary | ICD-10-CM | POA: Diagnosis not present

## 2022-11-19 DIAGNOSIS — R0602 Shortness of breath: Secondary | ICD-10-CM | POA: Diagnosis not present

## 2022-11-19 DIAGNOSIS — I10 Essential (primary) hypertension: Secondary | ICD-10-CM | POA: Diagnosis not present

## 2022-11-19 DIAGNOSIS — Z79899 Other long term (current) drug therapy: Secondary | ICD-10-CM | POA: Diagnosis not present

## 2022-11-19 MED ORDER — GUAIFENESIN-DM 100-10 MG/5ML PO SYRP
5.0000 mL | ORAL_SOLUTION | ORAL | 0 refills | Status: AC | PRN
  Filled 2022-11-19: qty 118, 4d supply, fill #0

## 2022-11-19 NOTE — ED Triage Notes (Signed)
C/o cough x 1 week. States tested negative for covid/flu. Does not want xray, states just wants meds for cough. States he has "bronchitis".

## 2022-11-19 NOTE — Discharge Instructions (Addendum)
Evaluation for your cough is overall reassuring.  X-ray did not reveal concern for pneumonia.  I have sent Robitussin DM to your pharmacy.  Recommend that you take it at night before you go to sleep.  If you have worsening shortness of breath, chest pain or uncontrolled fever or any other concerning symptom please return to the emergency department for further evaluation.

## 2022-11-19 NOTE — ED Provider Notes (Signed)
[facility] [place] Provider Note   CSN: [id number] Arrival date & time: 11/19/22  4098     History  Chief Complaint  Patient presents with   Cough   HPI Melvin Hamilton is a 77 y.o. male with OSA, HTN and arthritis presenting for cough.  Started started a week ago. Cough is mostly dry but at times produces clear sputum.  Cough has been persistent and now he is hurting in his ribs.  Was seen on Tuesday by medical provider who prescribed him doxycycline and prednisone and "cough pill"  which "has not touched it".  Endorses associated shortness of breath that is worse with coughing.  Denies chest pain and fever.  Requesting medication to treat his cough.  Denies calf tenderness, long trips.  No recent weight gain or lower extremity edema.  At this time he states that he is not short of breath.   Cough      Home Medications Prior to Admission medications   Medication Sig Start Date End Date Taking? Authorizing Provider  guaiFENesin-dextromethorphan (ROBITUSSIN DM) 100-10 MG/5ML syrup Take 5 mLs by mouth every 4 (four) hours as needed for cough. 11/19/22  Yes Harriet Pho, PA-C  ampicillin (PRINCIPEN) 500 MG capsule Take 1,500 mg by mouth See admin instructions. Take 3 capsules (1500 mg) by mouth 1 hour prior to dental appointment/procedures.    [provider]  betamethasone valerate lotion (VALISONE) 0.1 % Apply 1 application topically daily as needed (skin irritation.).  08/01/19   [provider]  clindamycin (CLINDAGEL) 1 % gel Apply 1 application topically daily as needed (skin irritation (applied to face)).  01/19/20   [provider]  colchicine 0.6 MG tablet Take 0.6 mg by mouth daily as needed (gout flare ups).  12/28/19   [provider]  fluocinolone (SYNALAR) 0.01 % external solution Apply 1 application topically daily as needed (scalp irritation).  01/19/20   [provider]   oxyCODONE-acetaminophen (PERCOCET) 5-325 MG tablet Take 1-2 tablets every 4 hours as needed for post operative pain. MAX 6/day 02/09/20   Grier Mitts, PA-C  tiZANidine (ZANAFLEX) 4 MG tablet Take 1 tablet (4 mg total) by mouth every 8 (eight) hours as needed for muscle spasms. 02/09/20   Grier Mitts, PA-C  triamcinolone cream (KENALOG) 0.1 % Apply 1 application topically daily as needed (skin irritation.).  10/13/19   [provider]  Vitamin D, Ergocalciferol, (DRISDOL) 1.25 MG (50000 UNIT) CAPS capsule Take 50,000 Units by mouth every Monday. 01/20/20   [provider]      Allergies    Morphine and related    Review of Systems   Review of Systems  Respiratory:  Positive for cough.     Physical Exam   Vitals:   11/19/22 0912  BP: (!) 142/69  Pulse: 85  Resp: 18  Temp: 98.2 F (36.8 C)  SpO2: 99%    CONSTITUTIONAL:  well-appearing, NAD NEURO:  Alert and oriented x 3, CN 3-12 grossly intact EYES:  eyes equal and reactive ENT/NECK:  Supple, no stridor  CARDIO:  regular rate and rhythm, appears well-perfused  PULM:  No respiratory distress, coarse in the left lower lung field otherwise clear to auscultation bilaterally. MSK/SPINE:  No gross deformities, no edema, moves all extremities, no swelling or tenderness around the calves.  SKIN:  no rash, atraumatic   *Additional and/or pertinent findings included in MDM below    ED Results / Procedures / Treatments  Labs (all labs ordered are listed, but only abnormal results are displayed) Labs Reviewed - No data to display  EKG None  Radiology DG Chest 2 View  Result Date: 11/19/2022 CLINICAL DATA:  Shortness of breath EXAM: CHEST - 2 VIEW COMPARISON:  02/01/2020 FINDINGS: The heart size and mediastinal contours are within normal limits. No focal airspace consolidation, pleural effusion, or pneumothorax. No acute bony findings. IMPRESSION: No active cardiopulmonary disease. Electronically  Signed   By: Davina Poke D.O.   On: 11/19/2022 11:05    Procedures Procedures    Medications Ordered in ED Medications - No data to display  ED Course/ Medical Decision Making/ A&P                             Medical Decision Making 77 year old male was well-appearing and hemodynamically stable presenting for cough.  Physical exam was mostly unremarkable did reveal some coarse breath sounds in the lower left base of the lungs.  Differential diagnosis for this complaint includes ACS, PE, CHF, pneumonia and URI.  Doubt ACS given no chest pain and vitals are stable.  Doubt PE given no calf tenderness, no long trips. No chest pain and at this time not short of breath.  Doubt CHF given no lower extremity edema and chest x-ray without pulmonary edema or effusion.  Doubt pneumonia given no focal opacity on x-ray.  Cough could be related to an ongoing URI.  Sent Robitussin DM to his pharmacy.  Discussed return precautions and advised him to follow-up with his PCP.  I personally reviewed and interpreted chest x-ray which did not reveal any active cardiopulmonary disease.        Final Clinical Impression(s) / ED Diagnoses Final diagnoses:  Cough, unspecified type    Rx / DC Orders ED Discharge Orders          Ordered    guaiFENesin-dextromethorphan (ROBITUSSIN DM) 100-10 MG/5ML syrup  Every 4 hours PRN        11/19/22 1123              Harriet Pho, PA-C 11/19/22 1125    Horton, Alvin Critchley, DO 11/19/22 1548

## 2022-12-18 ENCOUNTER — Ambulatory Visit: Payer: Medicare Other | Admitting: Family

## 2023-01-15 ENCOUNTER — Ambulatory Visit (INDEPENDENT_AMBULATORY_CARE_PROVIDER_SITE_OTHER): Payer: Medicare Other

## 2023-01-15 ENCOUNTER — Other Ambulatory Visit: Payer: Self-pay | Admitting: Family Medicine

## 2023-01-15 DIAGNOSIS — N200 Calculus of kidney: Secondary | ICD-10-CM | POA: Diagnosis not present

## 2023-07-29 ENCOUNTER — Other Ambulatory Visit: Payer: Self-pay | Admitting: Urology

## 2023-08-05 NOTE — Progress Notes (Signed)
Pre op phone call completed. Pt aware of instructions. Arrive at 0600.

## 2023-08-07 ENCOUNTER — Ambulatory Visit (HOSPITAL_COMMUNITY): Payer: Medicare Other

## 2023-08-07 ENCOUNTER — Ambulatory Visit (HOSPITAL_BASED_OUTPATIENT_CLINIC_OR_DEPARTMENT_OTHER)
Admission: RE | Admit: 2023-08-07 | Discharge: 2023-08-07 | Disposition: A | Discharge status: Home / Self Care | Payer: Medicare Other | Attending: Urology | Admitting: Urology

## 2023-08-07 ENCOUNTER — Encounter (HOSPITAL_BASED_OUTPATIENT_CLINIC_OR_DEPARTMENT_OTHER)
Admission: RE | Disposition: A | Discharge status: Home / Self Care | Payer: Self-pay | Source: Home / Self Care | Attending: Urology

## 2023-08-07 ENCOUNTER — Encounter (HOSPITAL_BASED_OUTPATIENT_CLINIC_OR_DEPARTMENT_OTHER): Payer: Self-pay | Admitting: Urology

## 2023-08-07 DIAGNOSIS — N200 Calculus of kidney: Secondary | ICD-10-CM | POA: Diagnosis present

## 2023-08-07 HISTORY — PX: EXTRACORPOREAL SHOCK WAVE LITHOTRIPSY: SHX1557

## 2023-08-07 SURGERY — LITHOTRIPSY, ESWL
Anesthesia: LOCAL | Laterality: Right

## 2023-08-07 MED ORDER — OXYCODONE-ACETAMINOPHEN 5-325 MG PO TABS: ORAL_TABLET | ORAL | 0 refills | Status: AC

## 2023-08-07 MED ORDER — CIPROFLOXACIN HCL 500 MG PO TABS
500.0000 mg | ORAL_TABLET | ORAL | Status: AC
  Administered 2023-08-07: 500 mg via ORAL

## 2023-08-07 MED ORDER — DIPHENHYDRAMINE HCL 25 MG PO CAPS
25.0000 mg | ORAL_CAPSULE | ORAL | Status: AC
  Administered 2023-08-07: 25 mg via ORAL

## 2023-08-07 MED ORDER — DIAZEPAM 5 MG PO TABS
10.0000 mg | ORAL_TABLET | ORAL | Status: AC
  Administered 2023-08-07: 10 mg via ORAL

## 2023-08-07 MED ORDER — CIPROFLOXACIN HCL 500 MG PO TABS
ORAL_TABLET | ORAL | Status: AC
  Filled 2023-08-07: qty 1

## 2023-08-07 MED ORDER — DIPHENHYDRAMINE HCL 25 MG PO CAPS
ORAL_CAPSULE | ORAL | Status: AC
  Filled 2023-08-07: qty 1

## 2023-08-07 MED ORDER — SODIUM CHLORIDE 0.9 % IV SOLN: INTRAVENOUS | Status: DC

## 2023-08-07 MED ORDER — DIAZEPAM 5 MG PO TABS
ORAL_TABLET | ORAL | Status: AC
  Filled 2023-08-07: qty 2

## 2023-08-07 NOTE — Op Note (Signed)
[facility] Operative Note  Treating Physician: Rhoderick Moody, MD  Pre-op diagnosis: Multiple right renal stones  Post-op diagnosis: Same   Procedure: RIGHT ESWL  See [facility] OP note scanned into chart. Also because of the size, density, location and other factors that cannot be anticipated I feel this will likely be a staged procedure. This fact supersedes any indication in the scanned [place] [facility] operative note to the contrary

## 2023-08-07 NOTE — H&P (Signed)
See scanned [facility] documents for H&P.   

## 2023-08-07 NOTE — Discharge Instructions (Signed)

## 2023-08-10 ENCOUNTER — Encounter (HOSPITAL_BASED_OUTPATIENT_CLINIC_OR_DEPARTMENT_OTHER): Payer: Self-pay | Admitting: Urology
